# Patient Record
Sex: Male | Born: 1945 | Race: Asian | Hispanic: No | Marital: Married | Smoking: Former smoker
Health system: Southern US, Community
[De-identification: ages and names within clinical notes are randomized; demographics above are authoritative.]

## PROBLEM LIST (undated history)

## (undated) DIAGNOSIS — I2510 Atherosclerotic heart disease of native coronary artery without angina pectoris: Secondary | ICD-10-CM

## (undated) DIAGNOSIS — F329 Major depressive disorder, single episode, unspecified: Secondary | ICD-10-CM

## (undated) DIAGNOSIS — I10 Essential (primary) hypertension: Secondary | ICD-10-CM

## (undated) DIAGNOSIS — E785 Hyperlipidemia, unspecified: Secondary | ICD-10-CM

## (undated) DIAGNOSIS — I639 Cerebral infarction, unspecified: Secondary | ICD-10-CM

## (undated) DIAGNOSIS — E119 Type 2 diabetes mellitus without complications: Secondary | ICD-10-CM

## (undated) DIAGNOSIS — K219 Gastro-esophageal reflux disease without esophagitis: Secondary | ICD-10-CM

## (undated) DIAGNOSIS — F32A Depression, unspecified: Secondary | ICD-10-CM

## (undated) HISTORY — DX: Major depressive disorder, single episode, unspecified: F32.9

## (undated) HISTORY — DX: Cerebral infarction, unspecified: I63.9

## (undated) HISTORY — DX: Hyperlipidemia, unspecified: E78.5

## (undated) HISTORY — DX: Type 2 diabetes mellitus without complications: E11.9

## (undated) HISTORY — DX: Gastro-esophageal reflux disease without esophagitis: K21.9

## (undated) HISTORY — DX: Essential (primary) hypertension: I10

## (undated) HISTORY — DX: Depression, unspecified: F32.A

---

## 2014-02-10 DIAGNOSIS — G8929 Other chronic pain: Secondary | ICD-10-CM | POA: Insufficient documentation

## 2014-02-10 DIAGNOSIS — R29898 Other symptoms and signs involving the musculoskeletal system: Secondary | ICD-10-CM | POA: Insufficient documentation

## 2014-03-01 DIAGNOSIS — R7301 Impaired fasting glucose: Secondary | ICD-10-CM | POA: Insufficient documentation

## 2015-01-10 DIAGNOSIS — Z86010 Personal history of colonic polyps: Secondary | ICD-10-CM | POA: Insufficient documentation

## 2015-01-10 DIAGNOSIS — D130 Benign neoplasm of esophagus: Secondary | ICD-10-CM | POA: Insufficient documentation

## 2015-01-10 DIAGNOSIS — K7689 Other specified diseases of liver: Secondary | ICD-10-CM | POA: Insufficient documentation

## 2015-07-05 DIAGNOSIS — I6381 Other cerebral infarction due to occlusion or stenosis of small artery: Secondary | ICD-10-CM | POA: Insufficient documentation

## 2015-07-05 DIAGNOSIS — K229 Disease of esophagus, unspecified: Secondary | ICD-10-CM | POA: Insufficient documentation

## 2015-07-05 DIAGNOSIS — F32A Depression, unspecified: Secondary | ICD-10-CM | POA: Insufficient documentation

## 2015-07-05 DIAGNOSIS — K635 Polyp of colon: Secondary | ICD-10-CM | POA: Insufficient documentation

## 2015-11-19 DIAGNOSIS — F5101 Primary insomnia: Secondary | ICD-10-CM | POA: Insufficient documentation

## 2015-11-19 DIAGNOSIS — Z8673 Personal history of transient ischemic attack (TIA), and cerebral infarction without residual deficits: Secondary | ICD-10-CM | POA: Insufficient documentation

## 2015-11-19 DIAGNOSIS — R351 Nocturia: Secondary | ICD-10-CM | POA: Insufficient documentation

## 2015-11-19 DIAGNOSIS — R3912 Poor urinary stream: Secondary | ICD-10-CM | POA: Insufficient documentation

## 2015-12-03 DIAGNOSIS — K769 Liver disease, unspecified: Secondary | ICD-10-CM | POA: Insufficient documentation

## 2015-12-03 DIAGNOSIS — K449 Diaphragmatic hernia without obstruction or gangrene: Secondary | ICD-10-CM | POA: Insufficient documentation

## 2015-12-03 DIAGNOSIS — R413 Other amnesia: Secondary | ICD-10-CM | POA: Insufficient documentation

## 2016-03-28 DIAGNOSIS — H259 Unspecified age-related cataract: Secondary | ICD-10-CM | POA: Insufficient documentation

## 2016-03-29 DIAGNOSIS — M5137 Other intervertebral disc degeneration, lumbosacral region: Secondary | ICD-10-CM | POA: Insufficient documentation

## 2016-03-31 DIAGNOSIS — E119 Type 2 diabetes mellitus without complications: Secondary | ICD-10-CM | POA: Insufficient documentation

## 2016-06-03 DIAGNOSIS — R200 Anesthesia of skin: Secondary | ICD-10-CM | POA: Insufficient documentation

## 2016-07-24 DIAGNOSIS — I351 Nonrheumatic aortic (valve) insufficiency: Secondary | ICD-10-CM | POA: Insufficient documentation

## 2017-03-10 DIAGNOSIS — F32A Depression, unspecified: Secondary | ICD-10-CM | POA: Insufficient documentation

## 2017-06-09 HISTORY — PX: COLONOSCOPY WITH ESOPHAGOGASTRODUODENOSCOPY (EGD): SHX5779

## 2018-01-11 ENCOUNTER — Ambulatory Visit: Payer: Self-pay | Admitting: Family Medicine

## 2018-01-11 DIAGNOSIS — Z0289 Encounter for other administrative examinations: Secondary | ICD-10-CM

## 2018-01-12 ENCOUNTER — Telehealth: Payer: Self-pay

## 2018-01-13 ENCOUNTER — Telehealth: Payer: Self-pay | Admitting: Family Medicine

## 2018-01-13 ENCOUNTER — Ambulatory Visit: Payer: Medicare Other | Admitting: Family Medicine

## 2018-01-13 ENCOUNTER — Encounter: Payer: Self-pay | Admitting: Family Medicine

## 2018-01-13 ENCOUNTER — Other Ambulatory Visit: Payer: Self-pay | Admitting: Family Medicine

## 2018-01-13 VITALS — BP 120/80 | HR 61 | Temp 97.8°F | Ht 66.0 in | Wt 152.5 lb

## 2018-01-13 DIAGNOSIS — H9193 Unspecified hearing loss, bilateral: Secondary | ICD-10-CM | POA: Insufficient documentation

## 2018-01-13 DIAGNOSIS — E119 Type 2 diabetes mellitus without complications: Secondary | ICD-10-CM

## 2018-01-13 DIAGNOSIS — E1165 Type 2 diabetes mellitus with hyperglycemia: Secondary | ICD-10-CM | POA: Insufficient documentation

## 2018-01-13 DIAGNOSIS — H269 Unspecified cataract: Secondary | ICD-10-CM

## 2018-01-13 LAB — HEMOGLOBIN A1C: HEMOGLOBIN A1C: 6.8 % — AB (ref 4.6–6.5)

## 2018-01-13 MED ORDER — METFORMIN HCL 500 MG PO TABS: 500.0000 mg | ORAL_TABLET | Freq: Two times a day (BID) | ORAL | 1 refills | Status: DC

## 2018-01-13 MED ORDER — HYDROCHLOROTHIAZIDE 12.5 MG PO CAPS: 12.5000 mg | ORAL_CAPSULE | Freq: Every day | ORAL | 1 refills | Status: DC

## 2018-01-13 MED ORDER — METOPROLOL TARTRATE 25 MG PO TABS: 25.0000 mg | ORAL_TABLET | Freq: Two times a day (BID) | ORAL | 1 refills | Status: DC

## 2018-01-13 MED ORDER — MIRTAZAPINE 30 MG PO TABS: 30.0000 mg | ORAL_TABLET | Freq: Every day | ORAL | 1 refills | Status: DC

## 2018-01-13 MED ORDER — AMLODIPINE BESYLATE 10 MG PO TABS: 10.0000 mg | ORAL_TABLET | Freq: Every day | ORAL | 1 refills | Status: DC

## 2018-01-13 MED ORDER — ATORVASTATIN CALCIUM 40 MG PO TABS: 40.0000 mg | ORAL_TABLET | Freq: Every day | ORAL | 3 refills | Status: DC

## 2018-01-13 MED ORDER — CLOPIDOGREL BISULFATE 75 MG PO TABS: 75.0000 mg | ORAL_TABLET | Freq: Every day | ORAL | 1 refills | Status: DC

## 2018-01-13 MED ORDER — ATORVASTATIN CALCIUM 40 MG PO TABS: 40.0000 mg | ORAL_TABLET | Freq: Every day | ORAL | 1 refills | Status: DC

## 2018-01-13 NOTE — Patient Instructions (Addendum)
Keep the diet clean and stay active.  If you do not hear anything about your referrasl in the next 1-2 weeks, call our office and ask for an update.  Give [place] 2-3 business days to get the results of your labs back.   Call your pharmacy for the shingles vaccine and flu shot.   Let us know if you need anything.

## 2018-01-13 NOTE — Progress Notes (Signed)
Chief Complaint  Patient presents with  . New Patient (Initial Visit)       New Patient Visit SUBJECTIVE: HPI: Bryan Henderson is an 72 y.o.male who is being seen for establishing care.     The patient was previously seen in [place].  He is here with his wife who helps provide the history due to her superior [place] speaking skills.  Patient with a relatively recent history of diabetes.  He was started on metformin around 3 months ago.  He has been tolerating well.  He exercises and eats healthy overall.  With the general [place], he does consume a lot of carbohydrates.  He does not have an eye provider in this area.  He plans get a flu shot at the pharmacy.  He is reportedly up-to-date with his pneumonia vaccines.  Patient has a history of hearing loss.  He was seen by an audiologist for years ago and was told he needed hearing aids.  The right side is worse than on the left.  No injury.  He is not having any pain or drainage from the ear.  He has a history of bilateral cataracts.  He was following closely with an eye provider up [place].  His wife would like him to see a specialist in case he needs surgery.  No Known Allergies  Past Medical History:  Diagnosis Date  . Depression   . Diabetes mellitus without complication ([place])   . Hypertension   . Stroke [facility])    History reviewed. No pertinent surgical history. History reviewed. No pertinent family history. No Known Allergies  Current Outpatient Medications:  .  amLODipine (NORVASC) 10 MG tablet, Take 1 tablet (10 mg total) by mouth daily., Disp: 90 tablet, Rfl: 1 .  b complex vitamins capsule, Take 1 capsule by mouth daily., Disp: , Rfl:  .  clopidogrel (PLAVIX) 75 MG tablet, Take 1 tablet (75 mg total) by mouth daily., Disp: 90 tablet, Rfl: 1 .  hydrochlorothiazide (MICROZIDE) 12.5 MG capsule, Take 1 capsule (12.5 mg total) by mouth daily., Disp: 90 capsule, Rfl: 1 .  metFORMIN (GLUCOPHAGE) 500 MG tablet, Take 1 tablet (500 mg  total) by mouth 2 (two) times daily with a meal., Disp: 180 tablet, Rfl: 1 .  metoprolol tartrate (LOPRESSOR) 25 MG tablet, Take 1 tablet (25 mg total) by mouth 2 (two) times daily., Disp: 180 tablet, Rfl: 1 .  mirtazapine (REMERON) 30 MG tablet, Take 1 tablet (30 mg total) by mouth at bedtime., Disp: 90 tablet, Rfl: 1 .  Multiple Vitamin (MULTIVITAMIN) tablet, Take 1 tablet by mouth daily., Disp: , Rfl:  .  Omega-3 Fatty Acids (FISH OIL) 1000 MG CAPS, Take 1 capsule by mouth daily., Disp: , Rfl:  .  vitamin C (ASCORBIC ACID) 500 MG tablet, Take 500 mg by mouth daily., Disp: , Rfl:  .  atorvastatin (LIPITOR) 40 MG tablet, Take 1 tablet (40 mg total) by mouth daily., Disp: 90 tablet, Rfl: 1    ROS Cardiovascular: Denies chest pain  Respiratory: Denies dyspnea   OBJECTIVE: BP 120/80 (BP Location: Left Arm, Patient Position: Sitting, Cuff Size: Normal)   Pulse 61   Temp 97.8 F (36.6 C) (Oral)   Ht 5\' 6"  (1.676 m)   Wt 152 lb 8 oz (69.2 kg)   SpO2 93%   BMI 24.61 kg/m   Constitutional: -  VS reviewed -  Well developed, well nourished, appears stated age -  No apparent distress  Psychiatric: -  Oriented to  person, place, and time -  Memory intact -  Affect and mood normal -  Fluent conversation, good eye contact -  Judgment and insight age appropriate  Eye: -  Conjunctivae clear, no discharge -  Pupils symmetric, round, reactive to light  ENMT: -  MMM    Pharynx moist, no exudate, no erythema  Neck: -  No gross swelling, no palpable masses -  Thyroid midline, not enlarged, mobile, no palpable masses  Cardiovascular: -  RRR -  No LE edema  Respiratory: -  Normal respiratory effort, no accessory muscle use, no retraction -  Breath sounds equal, no wheezes, no ronchi, no crackles  Musculoskeletal: -  No clubbing, no cyanosis -  Gait normal  Skin: -  No significant lesion on inspection -  Warm and dry to palpation   ASSESSMENT/PLAN: Diabetes mellitus type 2 in nonobese (HCC) -  Plan: Hemoglobin A1c, atorvastatin (LIPITOR) 40 MG tablet  Bilateral hearing loss, unspecified hearing loss type - Plan: Ambulatory referral to Audiology  Cataract of both eyes, unspecified cataract type - Plan: Ambulatory referral to Ophthalmology  Patient instructed to sign release of records form from his previous PCP. Counseled on diet and exercise. With a history of stroke and diabetes, will add Lipitor 40 mg daily. Referral to ophthalmology and audiology placed. Patient should return in 3 months for physical. The patient voiced understanding and agreement to the plan.   Swift, DO 01/13/18  12:21 PM

## 2018-01-13 NOTE — Telephone Encounter (Signed)
Called the patient to inform ok to schedule lab appt one week prior to February appt.  Currently appt is scheduled for 04/15/18.   Needs to change that to one week further out and schedule lab appt prior to that appt.(per PCP instructions, labs must be 90 days from previous ones)  Lab ordered entered.

## 2018-01-13 NOTE — Progress Notes (Signed)
Pre visit review using our clinic review tool, if applicable. No additional management support is needed unless otherwise documented below in the visit note. 

## 2018-01-14 ENCOUNTER — Telehealth: Payer: Self-pay

## 2018-01-14 NOTE — Telephone Encounter (Signed)
Copied from [place] [id number]. Topic: Referral - Status >> Jan 14, 2018 10:30 AM Sheran Luz wrote: Reason for CRM: Debbie from [facility] called stating patient has been scheduled for 12/11 at 10:00 with Dr. Freddi Che

## 2018-01-14 NOTE — Telephone Encounter (Signed)
Changes appt/scheduled lab appts.

## 2018-01-18 ENCOUNTER — Ambulatory Visit: Payer: Self-pay

## 2018-01-18 MED ORDER — MIRTAZAPINE 30 MG PO TABS: 30.0000 mg | ORAL_TABLET | Freq: Every day | ORAL | 1 refills | Status: DC

## 2018-01-18 NOTE — Telephone Encounter (Signed)
Called left message to call back 

## 2018-01-18 NOTE — Telephone Encounter (Signed)
Please verify that the metoprolol tartrate is what he is taking as that is a twice daily medication.   If he is on Metformin once daily, with his recent A1c, we could try going off of it. Please make sure that he is not on the long acting formulation of Metformin.   I thought we called in Remeron, but will send again.   TY.

## 2018-01-18 NOTE — Telephone Encounter (Signed)
Pt's wife called who spoke on behalf of her husband. Wife called stating that per pt's pill bottles his Metformin is one time a day not two times a day and his Metoprolol should be one time a day not two times per day. Wife stated that she wrote the information that was on his pill bottles on the medication record when they checked in to the appointment.  Per OV notes the Metformin and Metoprolol is twice daily.  Sending note to office for review.  Wife stated that the Remeron was not filled. Called [facility] and med will be filled today. Wife informed.    Reason for Disposition . [1] Caller requesting NON-URGENT health information AND [2] PCP's office is the best resource  Protocols used: INFORMATION ONLY CALL-A-AH

## 2018-01-18 NOTE — Addendum Note (Signed)
Addended by: Ames Coupe on: 01/18/2018 11:56 AM   Modules accepted: Orders

## 2018-01-19 NOTE — Telephone Encounter (Signed)
Called left message to call back 

## 2018-01-20 NOTE — Telephone Encounter (Signed)
Spoke to the patients wife Updated metoprolol to once daily per wife stated he takes only once a day. They would like to continue metformin 500 once daily until next lab check in February.  Then if a1c still good may consider at that time stopping. She will check with pharmacy regarding remeron today---was informed sent in. The wife verbalized understanding of all instructions.

## 2018-02-03 DIAGNOSIS — H903 Sensorineural hearing loss, bilateral: Secondary | ICD-10-CM | POA: Diagnosis not present

## 2018-03-05 NOTE — Telephone Encounter (Signed)
Error

## 2018-03-15 ENCOUNTER — Telehealth: Payer: Self-pay | Admitting: Family Medicine

## 2018-03-15 NOTE — Telephone Encounter (Signed)
Copied from [place] [phone] [phone]. Topic: Referral - Question >> Mar 12, 2018 10:27 AM Rutherford Nail, NT wrote: Reason for CRM: Patient's wife, Hylin, calling and states that they are at the ophthalmologist office and they do not have any records of the patient. States that she was under the impression that this ophthalmologist was associated with [facility]. Would like a call asap to discuss. Please advise.

## 2018-03-22 ENCOUNTER — Emergency Department (HOSPITAL_COMMUNITY): Payer: Medicare Other

## 2018-03-22 ENCOUNTER — Telehealth: Payer: Self-pay | Admitting: Family Medicine

## 2018-03-22 ENCOUNTER — Encounter (HOSPITAL_COMMUNITY): Payer: Self-pay | Admitting: Emergency Medicine

## 2018-03-22 ENCOUNTER — Ambulatory Visit (HOSPITAL_BASED_OUTPATIENT_CLINIC_OR_DEPARTMENT_OTHER)
Admission: RE | Admit: 2018-03-22 | Discharge: 2018-03-22 | Disposition: A | Discharge status: Home / Self Care | Payer: Medicare Other | Source: Ambulatory Visit | Attending: Internal Medicine | Admitting: Internal Medicine

## 2018-03-22 ENCOUNTER — Inpatient Hospital Stay (HOSPITAL_COMMUNITY)
Admission: EM | Admit: 2018-03-22 | Discharge: 2018-03-25 | DRG: 247 | Disposition: A | Discharge status: Home / Self Care | Payer: Medicare Other | Attending: Cardiology | Admitting: Cardiology

## 2018-03-22 ENCOUNTER — Other Ambulatory Visit: Payer: Self-pay

## 2018-03-22 ENCOUNTER — Ambulatory Visit (INDEPENDENT_AMBULATORY_CARE_PROVIDER_SITE_OTHER): Payer: Medicare Other | Admitting: Internal Medicine

## 2018-03-22 ENCOUNTER — Encounter: Payer: Self-pay | Admitting: Internal Medicine

## 2018-03-22 VITALS — BP 126/78 | HR 67 | Temp 97.8°F | Resp 16 | Ht 66.0 in | Wt 147.4 lb

## 2018-03-22 DIAGNOSIS — Z7902 Long term (current) use of antithrombotics/antiplatelets: Secondary | ICD-10-CM

## 2018-03-22 DIAGNOSIS — Z955 Presence of coronary angioplasty implant and graft: Secondary | ICD-10-CM | POA: Diagnosis not present

## 2018-03-22 DIAGNOSIS — I10 Essential (primary) hypertension: Secondary | ICD-10-CM | POA: Diagnosis not present

## 2018-03-22 DIAGNOSIS — E785 Hyperlipidemia, unspecified: Secondary | ICD-10-CM | POA: Diagnosis not present

## 2018-03-22 DIAGNOSIS — Z8673 Personal history of transient ischemic attack (TIA), and cerebral infarction without residual deficits: Secondary | ICD-10-CM | POA: Diagnosis not present

## 2018-03-22 DIAGNOSIS — R079 Chest pain, unspecified: Secondary | ICD-10-CM

## 2018-03-22 DIAGNOSIS — I214 Non-ST elevation (NSTEMI) myocardial infarction: Principal | ICD-10-CM | POA: Diagnosis present

## 2018-03-22 DIAGNOSIS — R0789 Other chest pain: Secondary | ICD-10-CM | POA: Diagnosis not present

## 2018-03-22 DIAGNOSIS — I2510 Atherosclerotic heart disease of native coronary artery without angina pectoris: Secondary | ICD-10-CM | POA: Diagnosis not present

## 2018-03-22 DIAGNOSIS — E119 Type 2 diabetes mellitus without complications: Secondary | ICD-10-CM | POA: Diagnosis not present

## 2018-03-22 DIAGNOSIS — I209 Angina pectoris, unspecified: Secondary | ICD-10-CM | POA: Diagnosis not present

## 2018-03-22 DIAGNOSIS — R0902 Hypoxemia: Secondary | ICD-10-CM | POA: Diagnosis not present

## 2018-03-22 DIAGNOSIS — K219 Gastro-esophageal reflux disease without esophagitis: Secondary | ICD-10-CM | POA: Diagnosis present

## 2018-03-22 DIAGNOSIS — I259 Chronic ischemic heart disease, unspecified: Secondary | ICD-10-CM | POA: Diagnosis present

## 2018-03-22 DIAGNOSIS — Z87891 Personal history of nicotine dependence: Secondary | ICD-10-CM | POA: Diagnosis not present

## 2018-03-22 DIAGNOSIS — I119 Hypertensive heart disease without heart failure: Secondary | ICD-10-CM | POA: Diagnosis not present

## 2018-03-22 DIAGNOSIS — R000 Tachycardia, unspecified: Secondary | ICD-10-CM | POA: Diagnosis not present

## 2018-03-22 DIAGNOSIS — E1165 Type 2 diabetes mellitus with hyperglycemia: Secondary | ICD-10-CM

## 2018-03-22 HISTORY — DX: Atherosclerotic heart disease of native coronary artery without angina pectoris: I25.10

## 2018-03-22 LAB — COMPREHENSIVE METABOLIC PANEL
ALT: 27 U/L (ref 0–44)
AST: 50 U/L — ABNORMAL HIGH (ref 15–41)
Albumin: 3.8 g/dL (ref 3.5–5.0)
Alkaline Phosphatase: 53 U/L (ref 38–126)
Anion gap: 14 (ref 5–15)
BILIRUBIN TOTAL: 0.8 mg/dL (ref 0.3–1.2)
BUN: 9 mg/dL (ref 8–23)
CO2: 25 mmol/L (ref 22–32)
Calcium: 8.8 mg/dL — ABNORMAL LOW (ref 8.9–10.3)
Chloride: 93 mmol/L — ABNORMAL LOW (ref 98–111)
Creatinine, Ser: 0.8 mg/dL (ref 0.61–1.24)
GFR calc Af Amer: >60 mL/min (ref >60)
GFR calc non Af Amer: >60 mL/min (ref >60)
Glucose, Bld: 182 mg/dL — ABNORMAL HIGH (ref 70–99)
Potassium: 3.3 mmol/L — ABNORMAL LOW (ref 3.5–5.1)
Sodium: 132 mmol/L — ABNORMAL LOW (ref 135–145)
TOTAL PROTEIN: 7.1 g/dL (ref 6.5–8.1)

## 2018-03-22 LAB — CBG MONITORING, ED: Glucose-Capillary: 176 mg/dL — ABNORMAL HIGH (ref 70–99)

## 2018-03-22 LAB — CBC WITH DIFFERENTIAL/PLATELET
Absolute Monocytes: 482 cells/uL (ref 200–950)
BASOS ABS: 27 {cells}/uL (ref 0–200)
Basophils Relative: 0.4 %
EOS ABS: 20 {cells}/uL (ref 15–500)
Eosinophils Relative: 0.3 %
HEMATOCRIT: 49.1 % (ref 38.5–50.0)
Hemoglobin: 17 g/dL (ref 13.2–17.1)
Lymphs Abs: 1320 cells/uL (ref 850–3900)
MCH: 31.3 pg (ref 27.0–33.0)
MCHC: 34.6 g/dL (ref 32.0–36.0)
MCV: 90.3 fL (ref 80.0–100.0)
MPV: 10.7 fL (ref 7.5–12.5)
Monocytes Relative: 7.2 %
Neutro Abs: 4851 cells/uL (ref 1500–7800)
Neutrophils Relative %: 72.4 %
Platelets: 287 10*3/uL (ref 140–400)
RBC: 5.44 10*6/uL (ref 4.20–5.80)
RDW: 12.9 % (ref 11.0–15.0)
Total Lymphocyte: 19.7 %
WBC: 6.7 10*3/uL (ref 3.8–10.8)

## 2018-03-22 LAB — CBC
HCT: 47.5 % (ref 39.0–52.0)
HEMOGLOBIN: 16.4 g/dL (ref 13.0–17.0)
MCH: 30.3 pg (ref 26.0–34.0)
MCHC: 34.5 g/dL (ref 30.0–36.0)
MCV: 87.6 fL (ref 80.0–100.0)
Platelets: 266 10*3/uL (ref 150–400)
RBC: 5.42 MIL/uL (ref 4.22–5.81)
RDW: 12.4 % (ref 11.5–15.5)
WBC: 7.2 10*3/uL (ref 4.0–10.5)
nRBC: 0 % (ref 0.0–0.2)

## 2018-03-22 LAB — BASIC METABOLIC PANEL
BUN: 11 mg/dL (ref 7–25)
CO2: 30 mmol/L (ref 20–32)
CREATININE: 0.92 mg/dL (ref 0.70–1.18)
Calcium: 9.6 mg/dL (ref 8.6–10.3)
Chloride: 96 mmol/L — ABNORMAL LOW (ref 98–110)
Glucose, Bld: 194 mg/dL — ABNORMAL HIGH (ref 65–99)
Potassium: 4.1 mmol/L (ref 3.5–5.3)
Sodium: 135 mmol/L (ref 135–146)

## 2018-03-22 LAB — TROPONIN I: Troponin I: 1.01 ng/mL (ref <=0.0)

## 2018-03-22 LAB — PROTIME-INR
INR: 1.03
Prothrombin Time: 13.4 seconds (ref 11.4–15.2)

## 2018-03-22 LAB — I-STAT TROPONIN, ED: Troponin i, poc: 0.91 ng/mL (ref 0.00–0.08)

## 2018-03-22 LAB — MAGNESIUM: MAGNESIUM: 1.6 mg/dL — AB (ref 1.7–2.4)

## 2018-03-22 MED ORDER — POTASSIUM CHLORIDE CRYS ER 20 MEQ PO TBCR
40.0000 meq | EXTENDED_RELEASE_TABLET | Freq: Once | ORAL | Status: AC
  Administered 2018-03-23: 40 meq via ORAL

## 2018-03-22 MED ORDER — HEPARIN (PORCINE) 25000 UT/250ML-% IV SOLN
800.0000 [IU]/h | INTRAVENOUS | Status: DC
  Administered 2018-03-22: 800 [IU]/h via INTRAVENOUS
  Filled 2018-03-22: qty 250

## 2018-03-22 MED ORDER — ASPIRIN 81 MG PO CHEW
324.0000 mg | CHEWABLE_TABLET | Freq: Once | ORAL | Status: AC
  Administered 2018-03-22: 324 mg via ORAL
  Filled 2018-03-22: qty 4

## 2018-03-22 MED ORDER — NITROGLYCERIN 0.4 MG SL SUBL
0.4000 mg | SUBLINGUAL_TABLET | SUBLINGUAL | Status: DC | PRN
  Administered 2018-03-22: 0.4 mg via SUBLINGUAL
  Filled 2018-03-22: qty 1

## 2018-03-22 MED ORDER — MAGNESIUM SULFATE 2 GM/50ML IV SOLN
2.0000 g | Freq: Once | INTRAVENOUS | Status: AC
  Administered 2018-03-22: 2 g via INTRAVENOUS
  Filled 2018-03-22: qty 50

## 2018-03-22 MED ORDER — PANTOPRAZOLE SODIUM 40 MG PO TBEC: 40.0000 mg | DELAYED_RELEASE_TABLET | Freq: Every day | ORAL | 0 refills | Status: DC

## 2018-03-22 MED ORDER — HEPARIN SODIUM (PORCINE) 5000 UNIT/ML IJ SOLN
60.0000 [IU]/kg | Freq: Once | INTRAMUSCULAR | Status: AC
  Administered 2018-03-22: 3950 [IU] via INTRAVENOUS
  Filled 2018-03-22: qty 1

## 2018-03-22 MED ORDER — SODIUM CHLORIDE 0.9% FLUSH: 3.0000 mL | Freq: Once | INTRAVENOUS | Status: DC

## 2018-03-22 MED ORDER — NITROGLYCERIN IN D5W 200-5 MCG/ML-% IV SOLN
0.0000 ug/min | INTRAVENOUS | Status: DC
  Administered 2018-03-22: 5 ug/min via INTRAVENOUS
  Filled 2018-03-22: qty 250

## 2018-03-22 MED FILL — PANTOPRAZOLE SOD DR 40 MG T: 40 | 30 days supply | Qty: 30 | Fill #0

## 2018-03-22 NOTE — Progress Notes (Signed)
Pre visit review using our clinic review tool, if applicable. No additional management support is needed unless otherwise documented below in the visit note. 

## 2018-03-22 NOTE — ED Provider Notes (Signed)
[facility] EMERGENCY DEPARTMENT Provider Note   CSN: [id number] Arrival date & time: 03/22/18  2016     History   Chief Complaint Chief Complaint  Patient presents with  . Chest Pain  . Elevated Troponin    HPI Bryan Henderson is a 73 y.o. male.  HPI   Bryan Henderson is a 73 y.o. male with PMH of pression, diabetes, hypertension, CVA with no residual deficits on Plavix who presents with his wife at the bedside via EMS from home after the patient was found to have a positive troponin in the clinic earlier today.  The patient has no history of CAD and last had a stress test around 5 years ago while living in [place].  He is active and walks several miles per day several times per week.  Has never had chest pain or dyspnea on exertion.  Sunday night he woke up around 12:30 AM with a sour taste in his mouth and a burning in his chest.  He took Nexium and this improved slightly.  No history of GERD.  In the morning his pain returned and persisted so he went to the PCP.  At this time, the patient feels slightly improved after receiving nitroglycerin x1 and a full dose of aspirin by EMS.  Only a slight burning sensation in the substernal space at this time.  No nausea or vomiting and no lightheadedness or diaphoresis.  No recent falls or trauma.  No abdominal pain.  Past Medical History:  Diagnosis Date  . Depression   . Diabetes mellitus without complication ([place])   . Hypertension   . Stroke [facility])     Patient Active Problem List   Diagnosis Date Noted  . Cataract of both eyes 01/13/2018  . Diabetes mellitus type 2 in nonobese ([place]) 01/13/2018  . Bilateral hearing loss 01/13/2018    History reviewed. No pertinent surgical history.      Home Medications    Prior to Admission medications   Medication Sig Start Date End Date Taking? Authorizing Provider  amLODipine (NORVASC) 10 MG tablet Take 1 tablet (10 mg total) by mouth daily. 01/13/18  Yes Shelda Pal, DO  atorvastatin (LIPITOR) 40 MG tablet Take 1 tablet (40 mg total) by mouth daily. 01/13/18  Yes Shelda Pal, DO  b complex vitamins capsule Take 1 capsule by mouth daily.   Yes [provider]  clopidogrel (PLAVIX) 75 MG tablet Take 1 tablet (75 mg total) by mouth daily. 01/13/18  Yes Shelda Pal, DO  hydrochlorothiazide (MICROZIDE) 12.5 MG capsule Take 1 capsule (12.5 mg total) by mouth daily. 01/13/18  Yes Shelda Pal, DO  metFORMIN (GLUCOPHAGE) 500 MG tablet Take 1 tablet (500 mg total) by mouth 2 (two) times daily with a meal. 01/13/18  Yes Wendling, Crosby Oyster, DO  metoprolol tartrate (LOPRESSOR) 25 MG tablet Take 1 tablet (25 mg total) by mouth 2 (two) times daily. 01/13/18  Yes Shelda Pal, DO  mirtazapine (REMERON) 30 MG tablet Take 1 tablet (30 mg total) by mouth at bedtime. 01/18/18  Yes Shelda Pal, DO  Multiple Vitamin (MULTIVITAMIN) tablet Take 1 tablet by mouth daily.   Yes [provider]  Omega-3 Fatty Acids (FISH OIL) 1000 MG CAPS Take 1 capsule by mouth daily.   Yes [provider]  pantoprazole (PROTONIX) 40 MG tablet Take 1 tablet (40 mg total) by mouth daily. 03/22/18  Yes Colon Branch, MD  vitamin C (ASCORBIC ACID)  500 MG tablet Take 500 mg by mouth daily.   Yes [provider]    Family History No family history on file.  Social History Social History   Tobacco Use  . Smoking status: Never Smoker  . Smokeless tobacco: Never Used  Substance Use Topics  . Alcohol use: Never    Frequency: Never  . Drug use: Never     Allergies   Patient has no known allergies.   Review of Systems Review of Systems  Constitutional: Negative for chills and fever.  HENT: Negative for ear pain and sore throat.   Eyes: Negative for pain and visual disturbance.  Respiratory: Negative for cough and shortness of breath.   Cardiovascular: Positive for chest pain.  Negative for palpitations.  Gastrointestinal: Negative for abdominal pain and vomiting.  Genitourinary: Negative for dysuria and hematuria.  Musculoskeletal: Negative for arthralgias and back pain.  Skin: Negative for color change and rash.  Neurological: Negative for seizures and syncope.  All other systems reviewed and are negative.    Physical Exam Updated Vital Signs BP (!) 147/81   Pulse 86   Temp 97.6 F (36.4 C) (Oral)   Resp (!) 25   Ht 5\' 7"  (1.702 m)   Wt 66.2 kg   SpO2 94%   BMI 22.87 kg/m   Physical Exam Vitals signs and nursing note reviewed.  Constitutional:      Appearance: He is well-developed.  HENT:     Head: Normocephalic and atraumatic.  Eyes:     Conjunctiva/sclera: Conjunctivae normal.  Neck:     Musculoskeletal: Neck supple.  Cardiovascular:     Rate and Rhythm: Normal rate and regular rhythm.     Heart sounds: No murmur.  Pulmonary:     Effort: Pulmonary effort is normal. No respiratory distress.     Breath sounds: Normal breath sounds.  Abdominal:     Palpations: Abdomen is soft.     Tenderness: There is no abdominal tenderness.  Skin:    General: Skin is warm and dry.  Neurological:     Mental Status: He is alert.      ED Treatments / Results  Labs (all labs ordered are listed, but only abnormal results are displayed) Labs Reviewed  MAGNESIUM - Abnormal; Notable for the following components:      Result Value   Magnesium 1.6 (*)    All other components within normal limits  COMPREHENSIVE METABOLIC PANEL - Abnormal; Notable for the following components:   Sodium 132 (*)    Potassium 3.3 (*)    Chloride 93 (*)    Glucose, Bld 182 (*)    Calcium 8.8 (*)    AST 50 (*)    All other components within normal limits  I-STAT TROPONIN, ED - Abnormal; Notable for the following components:   Troponin i, poc 0.91 (*)    All other components within normal limits  CBG MONITORING, ED - Abnormal; Notable for the following components:    Glucose-Capillary 176 (*)    All other components within normal limits  I-STAT TROPONIN, ED - Abnormal; Notable for the following components:   Troponin i, poc 1.55 (*)    All other components within normal limits  CBC  PROTIME-INR  HEPARIN LEVEL (UNFRACTIONATED)  CBC  I-STAT TROPONIN, ED    EKG EKG Interpretation  Date/Time:  Monday March 22 2018 20:31:29 EST Ventricular Rate:  77 PR Interval:    QRS Duration: 87 QT Interval:  413 QTC Calculation: 468 R  Axis:     Text Interpretation:  Sinus rhythm Consider RVH or posterior infarct No old tracing to compare Confirmed by Merrily Pew [id number]) on 03/22/2018 8:51:25 PM   Radiology Dg Chest Portable 1 View  Result Date: 03/22/2018 CLINICAL DATA:  Chest pain. EXAM: PORTABLE CHEST 1 VIEW COMPARISON:  Chest x-ray from same day at 15:37. FINDINGS: The heart size and mediastinal contours are within normal limits. Normal pulmonary vascularity. No focal consolidation, pleural effusion, or pneumothorax. No acute osseous abnormality. IMPRESSION: No active disease. Electronically Signed   By: Titus Dubin M.D.   On: 03/22/2018 21:28    Procedures Procedures (including critical care time)  Medications Ordered in ED Medications  sodium chloride flush (NS) 0.9 % injection 3 mL (3 mLs Intravenous Not Given 03/22/18 2317)  nitroGLYCERIN (NITROSTAT) SL tablet 0.4 mg (0.4 mg Sublingual Given 03/22/18 2106)  nitroGLYCERIN 50 mg in dextrose 5 % 250 mL (0.2 mg/mL) infusion (20 mcg/min Intravenous Rate/Dose Change 03/23/18 0043)  heparin ADULT infusion 100 units/mL (25000 units/256mL sodium chloride 0.45%) (800 Units/hr Intravenous New Bag/Given 03/22/18 2311)  potassium chloride SA (K-DUR,KLOR-CON) CR tablet 40 mEq (has no administration in time range)  aspirin chewable tablet 324 mg (324 mg Oral Given 03/22/18 2107)  heparin injection 3,950 Units (3,950 Units Intravenous Given 03/22/18 2108)  magnesium sulfate IVPB 2 g 50 mL (0 g Intravenous  Stopped 03/23/18 0016)     Initial Impression / Assessment and Plan / ED Course  I have reviewed the triage vital signs and the nursing notes.  Pertinent labs & imaging results that were available during my care of the patient were reviewed by me and considered in my medical decision making (see chart for details).     MDM:  Imaging: Chest x-ray normal.  ED Provider Interpretation of EKG: Sinus rhythm with a rate of 77 bpm, normal axis, no ST segment elevation.  Subtle ST segment depression in lead V3.  Flattening of T waves in leads aVL and V2.  Slight worsening of isolated depression in V3 when compared to EKG from earlier today at around 3 PM.  Labs: I-STAT troponin 0.91 and increased to 1.55, glucose 176, CBC normal, CMP with a potassium of 3.3 and otherwise unremarkable, magnesium 1.6, INR 1.03  On initial evaluation, patient appears well. Afebrile and hemodynamically stable. Alert and oriented x4, pleasant, and cooperative.  Patient presents with chest discomfort and positive troponin from clinic as detailed above.  On exam, patient has no focal abnormality.  EKG obtained on arrival shows subtle increase in ST segment depression in lead V3 which is isolated without reciprocal changes or evidence for ST segment elevation when compared to previous EKG from earlier today.  He reported only mild discomfort at this time after receiving sublingual nitroglycerin x1 by EMS.  Additional sublingual nitroglycerin ordered and nitroglycerin infusion ordered as well.  Chest x-ray is unremarkable and he has no neurologic deficit or pulse discrepancy.  No back pain.  Doubt dissection.  No pneumonia or pneumothorax.  No evidence for infectious etiology.  Doubt pericarditis or myocarditis.  His story is somewhat inconsistent with ACS, however, his troponin increased from 0.9-1.5.  After initial presentation prior to increase in troponin (after confirming with the patient and his wife that he takes no other  anticoagulants) patient was given heparin bolus and started on heparin infusion.  Given 2 g IV magnesium for mag 1.6 as above and given 40 mEq of K LR in the ED.  Cardiology was consulted and  evaluated patient at the bedside in the ED.  Cardiology admitted patient thereafter awaiting his bed in stable condition.  He was pain-free at time of transfer of care while waiting inpatient bed.  The plan for this patient was discussed with Dr. Dayna Barker who voiced agreement and who oversaw evaluation and treatment of this patient.   The patient was fully informed and involved with the history taking, evaluation, workup including labs/images, and plan. The patient's concerns and questions were addressed to the patient's satisfaction and he expressed agreement with the plan to admit.    Final Clinical Impressions(s) / ED Diagnoses   Final diagnoses:  NSTEMI (non-ST elevated myocardial infarction) ([place])  Chest pain due to myocardial ischemia, unspecified ischemic chest pain type    ED Discharge Orders    None       Yacoub Diltz, Rodena Goldmann, MD 03/23/18 9977    Merrily Pew, MD 03/24/18 [phone]

## 2018-03-22 NOTE — Progress Notes (Signed)
Subjective:    Patient ID: Bryan Henderson, male    DOB: Jun 16, 1945, 73 y.o.   MRN: [id number]  DOS:  03/22/2018 Type of visit - description: Acute visit, here with his wife. The history was obtained initially from the wife and then from the patient who seems to understand English but has a difficult time explaining his symptoms. Apparently he is having pain on and off at the mid- upper chest, x 1 week; mostly with food intake, described as a sour or burning feeling. Last night, was awake by it, the pain lasted about 2 hours, eventually took Nexium and symptoms abated. No symptoms today.  Reports he had a colonoscopy and a normal EGD 2 years ago when he was living in [place].  Does not recall why EGD was done  but it was normal.   Review of Systems Denies fever chills No cough No difficulty breathing No palpitations Denies any change in the bowel habits or change in the color of the stools.  No dysphagia or odynophagia  Past Medical History:  Diagnosis Date  . Depression   . Diabetes mellitus without complication ([place])   . Hypertension   . Stroke [facility])     No past surgical history on file.  Social History   Socioeconomic History  . Marital status: Married    Spouse name: Not on file  . Number of children: Not on file  . Years of education: Not on file  . Highest education level: Not on file  Occupational History  . Not on file  Social Needs  . Financial resource strain: Not on file  . Food insecurity:    Worry: Not on file    Inability: Not on file  . Transportation needs:    Medical: Not on file    Non-medical: Not on file  Tobacco Use  . Smoking status: Never Smoker  . Smokeless tobacco: Never Used  Substance and Sexual Activity  . Alcohol use: Never    Frequency: Never  . Drug use: Never  . Sexual activity: Not on file  Lifestyle  . Physical activity:    Days per week: Not on file    Minutes per session: Not on file  . Stress: Not on file    Relationships  . Social connections:    Talks on phone: Not on file    Gets together: Not on file    Attends religious service: Not on file    Active member of club or organization: Not on file    Attends meetings of clubs or organizations: Not on file    Relationship status: Not on file  . Intimate partner violence:    Fear of current or ex partner: Not on file    Emotionally abused: Not on file    Physically abused: Not on file    Forced sexual activity: Not on file  Other Topics Concern  . Not on file  Social History Narrative  . Not on file      Allergies as of 03/22/2018   No Known Allergies     Medication List       Accurate as of March 22, 2018  2:40 PM. Always use your most recent med list.        amLODipine 10 MG tablet Commonly known as:  NORVASC Take 1 tablet (10 mg total) by mouth daily.   atorvastatin 40 MG tablet Commonly known as:  LIPITOR Take 1 tablet (40 mg total) by mouth daily.  b complex vitamins capsule Take 1 capsule by mouth daily.   clopidogrel 75 MG tablet Commonly known as:  PLAVIX Take 1 tablet (75 mg total) by mouth daily.   Fish Oil 1000 MG Caps Take 1 capsule by mouth daily.   hydrochlorothiazide 12.5 MG capsule Commonly known as:  MICROZIDE Take 1 capsule (12.5 mg total) by mouth daily.   metFORMIN 500 MG tablet Commonly known as:  GLUCOPHAGE Take 1 tablet (500 mg total) by mouth 2 (two) times daily with a meal.   metoprolol tartrate 25 MG tablet Commonly known as:  LOPRESSOR Take 1 tablet (25 mg total) by mouth 2 (two) times daily.   mirtazapine 30 MG tablet Commonly known as:  REMERON Take 1 tablet (30 mg total) by mouth at bedtime.   multivitamin tablet Take 1 tablet by mouth daily.   vitamin C 500 MG tablet Commonly known as:  ASCORBIC ACID Take 500 mg by mouth daily.           Objective:   Physical Exam BP 126/78 (BP Location: Left Arm, Patient Position: Sitting, Cuff Size: Small)   Pulse 67    Temp 97.8 F (36.6 C) (Oral)   Resp 16   Ht 5\' 6"  (1.676 m)   Wt 147 lb 6 oz (66.8 kg)   SpO2 98%   BMI 23.79 kg/m        General:   Well developed, NAD, BMI noted.  HEENT:  Normocephalic . Face symmetric, atraumatic Lungs:  CTA B Normal respiratory effort, no intercostal retractions, no accessory muscle use. Heart: RRR,  no murmur.  no pretibial edema bilaterally  Abdomen:  Not distended, soft, non-tender. No rebound or rigidity.   Skin: Not pale. Not jaundice Neurologic:  alert & oriented X3.  Speech normal, gait appropriate for age and unassisted Psych--  Cognition and judgment appear intact.  Cooperative with normal attention span and concentration.  Behavior appropriate. No anxious or depressed appearing.  Assessment    73 year old male PMH includes diabetes, high cholesterol, HTN, stroke, presents with: Chest pain: The patient presents with atypical symptoms for CAD, possibly GERD however he is diabetic, has a history of a stroke consequently he is high risk. I am concerned because the history might not be accurate thus the plan is:  Chest x-ray, stat troponin, BMP and CBC. Cardiology referral ER if chest pain resurface. GERD: Could be the explanation of his symptoms, start Protonix. See PCP in 2 weeks Recommend to have a translator for every medical visit All of the above was carefully discussed with the patient's wife, she verbalized understanding

## 2018-03-22 NOTE — Telephone Encounter (Signed)
Raquel Sarna, Client Service Rep from [facility] called a report of elevated Troponin I 1.01, she says it was verified with repeat analysis. Result read back and verified, collection date 03/22/18. Attempted to call the on call physician [phone], no answer, no VM set up. Attempted to call on call physician [phone], left VM to return call to the office [phone]. Will route to office high priority.

## 2018-03-22 NOTE — Progress Notes (Signed)
ANTICOAGULATION CONSULT NOTE - Initial Consult  Pharmacy Consult for Heparin Indication: chest pain/ACS  No Known Allergies  Patient Measurements: Height: 5\' 7"  (170.2 cm) Weight: 146 lb (66.2 kg) IBW/kg (Calculated) : 66.1  Vital Signs: Temp: 97.6 F (36.4 C) (01/27 2023) Temp Source: Oral (01/27 2023) BP: 156/87 (01/27 2047) Pulse Rate: 79 (01/27 2047)  Labs: Recent Labs    03/22/18 1524 03/22/18 2048  HGB 17.0 16.4  HCT 49.1 47.5  PLT 287 266  CREATININE 0.92  --   TROPONINI 1.01*  --     Estimated Creatinine Clearance: 65.9 mL/min (by C-G formula based on SCr of 0.92 mg/dL).   Medical History: Past Medical History:  Diagnosis Date  . Depression   . Diabetes mellitus without complication ([place])   . Hypertension   . Stroke [facility])     Assessment: 73 year old male presents with atypical symptoms for CAD, possibly GERD. Pharmacy consulted for heparin dosing. Patient only on Plavix PTA  Goal of Therapy:  Heparin level 0.3-0.7 units/ml Monitor platelets by anticoagulation protocol: Yes   Plan:  Give 3950 units bolus x 1 Start heparin infusion at 800 units/hr Check anti-Xa level in 6 hours and daily while on heparin Continue to monitor H&H and platelets  Alanda Slim, PharmD, [facility] Clinical Pharmacist Please see AMION for all Pharmacists' Contact Phone Numbers 03/22/2018, 9:12 PM

## 2018-03-22 NOTE — Patient Instructions (Addendum)
GO TO THE LAB : Get the blood work     GO TO THE FRONT DESK Schedule your next appointment   to see your primary doctor in 2 weeks from today  You need to bring a translator  Start Protonix: Take 1 before your dinner today Then take 1 tablet before every breakfast on an empty stomach  Go to the ER or call 911 if you have chest pain again    STOP BY THE FIRST FLOOR:  get the XR   We are referring you to see a cardiologist

## 2018-03-22 NOTE — ED Triage Notes (Signed)
Pt BIB [facility] for chest pain and an elevated troponin. EMS advised the pt was seen at [facility] Primary care this date for some chest pain that had been going on for a week. EMS advised the family received a call from his PCP that his troponin was elevated and he needed to get to the Emergency Department. EMS advised they gave 324 ASA and 1 SL nitro tablet with a reported relief in pain. EMS advised vital signs were stable and an IV was started as documented.   Pt reports some chest pain but is unable to describe same.

## 2018-03-22 NOTE — Telephone Encounter (Signed)
Per verbal from Debbrah Alar NP, pt should be evaluated in the ER right away and if pt is still having chest discomfort or pain, he should call 911. Spoke with pt's spouse. She states pt is still having chest discomfort / squeezing sensation. Advised her to call 911 and she voices understanding and gives verbal agreement that she will call 911.

## 2018-03-23 ENCOUNTER — Encounter (HOSPITAL_COMMUNITY)
Admission: EM | Disposition: A | Discharge status: Home / Self Care | Payer: Self-pay | Source: Home / Self Care | Attending: Cardiology

## 2018-03-23 ENCOUNTER — Inpatient Hospital Stay (HOSPITAL_COMMUNITY): Payer: Medicare Other

## 2018-03-23 ENCOUNTER — Encounter (HOSPITAL_COMMUNITY): Payer: Self-pay | Admitting: Interventional Cardiology

## 2018-03-23 DIAGNOSIS — I259 Chronic ischemic heart disease, unspecified: Secondary | ICD-10-CM | POA: Diagnosis present

## 2018-03-23 DIAGNOSIS — E119 Type 2 diabetes mellitus without complications: Secondary | ICD-10-CM | POA: Diagnosis not present

## 2018-03-23 DIAGNOSIS — K219 Gastro-esophageal reflux disease without esophagitis: Secondary | ICD-10-CM | POA: Diagnosis not present

## 2018-03-23 DIAGNOSIS — I10 Essential (primary) hypertension: Secondary | ICD-10-CM | POA: Diagnosis not present

## 2018-03-23 DIAGNOSIS — I214 Non-ST elevation (NSTEMI) myocardial infarction: Secondary | ICD-10-CM | POA: Diagnosis not present

## 2018-03-23 DIAGNOSIS — I119 Hypertensive heart disease without heart failure: Secondary | ICD-10-CM | POA: Diagnosis not present

## 2018-03-23 DIAGNOSIS — Z87891 Personal history of nicotine dependence: Secondary | ICD-10-CM | POA: Diagnosis not present

## 2018-03-23 DIAGNOSIS — I2510 Atherosclerotic heart disease of native coronary artery without angina pectoris: Secondary | ICD-10-CM | POA: Diagnosis not present

## 2018-03-23 DIAGNOSIS — Z8673 Personal history of transient ischemic attack (TIA), and cerebral infarction without residual deficits: Secondary | ICD-10-CM | POA: Diagnosis not present

## 2018-03-23 DIAGNOSIS — E785 Hyperlipidemia, unspecified: Secondary | ICD-10-CM | POA: Diagnosis not present

## 2018-03-23 DIAGNOSIS — Z955 Presence of coronary angioplasty implant and graft: Secondary | ICD-10-CM | POA: Diagnosis not present

## 2018-03-23 DIAGNOSIS — Z7902 Long term (current) use of antithrombotics/antiplatelets: Secondary | ICD-10-CM | POA: Diagnosis not present

## 2018-03-23 HISTORY — PX: CORONARY STENT INTERVENTION: CATH118234

## 2018-03-23 HISTORY — PX: LEFT HEART CATH AND CORONARY ANGIOGRAPHY: CATH118249

## 2018-03-23 LAB — PROTIME-INR
INR: 1.11
Prothrombin Time: 14.2 seconds (ref 11.4–15.2)

## 2018-03-23 LAB — HEPARIN LEVEL (UNFRACTIONATED): Heparin Unfractionated: 0.37 IU/mL (ref 0.30–0.70)

## 2018-03-23 LAB — CBC
HEMATOCRIT: 44.8 % (ref 39.0–52.0)
HEMATOCRIT: 44.8 % (ref 39.0–52.0)
Hemoglobin: 15.8 g/dL (ref 13.0–17.0)
Hemoglobin: 15.6 g/dL (ref 13.0–17.0)
MCH: 30.4 pg (ref 26.0–34.0)
MCH: 30.1 pg (ref 26.0–34.0)
MCHC: 35.3 g/dL (ref 30.0–36.0)
MCHC: 34.8 g/dL (ref 30.0–36.0)
MCV: 86.3 fL (ref 80.0–100.0)
MCV: 86.5 fL (ref 80.0–100.0)
Platelets: 283 10*3/uL (ref 150–400)
Platelets: 286 10*3/uL (ref 150–400)
RBC: 5.19 MIL/uL (ref 4.22–5.81)
RBC: 5.18 MIL/uL (ref 4.22–5.81)
RDW: 12.3 % (ref 11.5–15.5)
RDW: 12.3 % (ref 11.5–15.5)
WBC: 11.5 10*3/uL — ABNORMAL HIGH (ref 4.0–10.5)
WBC: 11.7 10*3/uL — ABNORMAL HIGH (ref 4.0–10.5)
nRBC: 0 % (ref 0.0–0.2)
nRBC: 0 % (ref 0.0–0.2)

## 2018-03-23 LAB — BASIC METABOLIC PANEL
Anion gap: 14 (ref 5–15)
BUN: 7 mg/dL — ABNORMAL LOW (ref 8–23)
CO2: 23 mmol/L (ref 22–32)
CREATININE: 0.81 mg/dL (ref 0.61–1.24)
Calcium: 8.5 mg/dL — ABNORMAL LOW (ref 8.9–10.3)
Chloride: 97 mmol/L — ABNORMAL LOW (ref 98–111)
GFR calc Af Amer: >60 mL/min (ref >60)
GFR calc non Af Amer: >60 mL/min (ref >60)
Glucose, Bld: 164 mg/dL — ABNORMAL HIGH (ref 70–99)
Potassium: 3.2 mmol/L — ABNORMAL LOW (ref 3.5–5.1)
SODIUM: 134 mmol/L — AB (ref 135–145)

## 2018-03-23 LAB — POCT ACTIVATED CLOTTING TIME
Activated Clotting Time: 323 seconds
Activated Clotting Time: 538 seconds

## 2018-03-23 LAB — I-STAT TROPONIN, ED: Troponin i, poc: 1.55 ng/mL (ref 0.00–0.08)

## 2018-03-23 LAB — HEMOGLOBIN A1C
Hgb A1c MFr Bld: 6.8 % — ABNORMAL HIGH (ref 4.8–5.6)
Mean Plasma Glucose: 148.46 mg/dL

## 2018-03-23 LAB — TROPONIN I
TROPONIN I: 4.94 ng/mL — AB (ref <0.03)
Troponin I: 3.95 ng/mL (ref <0.03)
Troponin I: 30.32 ng/mL (ref <0.03)
Troponin I: 15.5 ng/mL (ref <0.03)

## 2018-03-23 LAB — LIPID PANEL
Cholesterol: 128 mg/dL (ref 0–200)
HDL: 32 mg/dL — ABNORMAL LOW (ref >40)
LDL Cholesterol: 81 mg/dL (ref 0–99)
TRIGLYCERIDES: 76 mg/dL (ref <150)
Total CHOL/HDL Ratio: 4 RATIO
VLDL: 15 mg/dL (ref 0–40)

## 2018-03-23 LAB — ECHOCARDIOGRAM COMPLETE
Height: 67 in
Weight: 2336 oz

## 2018-03-23 LAB — CBG MONITORING, ED: Glucose-Capillary: 166 mg/dL — ABNORMAL HIGH (ref 70–99)

## 2018-03-23 SURGERY — LEFT HEART CATH AND CORONARY ANGIOGRAPHY
Anesthesia: LOCAL

## 2018-03-23 MED ORDER — HEPARIN (PORCINE) IN NACL 1000-0.9 UT/500ML-% IV SOLN
INTRAVENOUS | Status: AC
  Filled 2018-03-23: qty 1000

## 2018-03-23 MED ORDER — SODIUM CHLORIDE 0.9% FLUSH: 3.0000 mL | Freq: Two times a day (BID) | INTRAVENOUS | Status: DC

## 2018-03-23 MED ORDER — ACETAMINOPHEN 325 MG PO TABS: 650.0000 mg | ORAL_TABLET | ORAL | Status: DC | PRN

## 2018-03-23 MED ORDER — CLOPIDOGREL BISULFATE 300 MG PO TABS
ORAL_TABLET | ORAL | Status: AC
  Filled 2018-03-23: qty 1

## 2018-03-23 MED ORDER — TIROFIBAN (AGGRASTAT) BOLUS VIA INFUSION
INTRAVENOUS | Status: DC | PRN
  Administered 2018-03-23: 1655 ug via INTRAVENOUS

## 2018-03-23 MED ORDER — MIRTAZAPINE 30 MG PO TABS
30.0000 mg | ORAL_TABLET | Freq: Every day | ORAL | Status: DC
  Administered 2018-03-23 – 2018-03-24 (×2): 30 mg via ORAL
  Filled 2018-03-23 (×2): qty 1

## 2018-03-23 MED ORDER — MIDAZOLAM HCL 2 MG/2ML IJ SOLN
INTRAMUSCULAR | Status: DC | PRN
  Administered 2018-03-23 (×2): 1 mg via INTRAVENOUS

## 2018-03-23 MED ORDER — TIROFIBAN HCL IN NACL 5-0.9 MG/100ML-% IV SOLN
INTRAVENOUS | Status: AC
  Filled 2018-03-23: qty 100

## 2018-03-23 MED ORDER — LIDOCAINE HCL (PF) 1 % IJ SOLN
INTRAMUSCULAR | Status: AC
  Filled 2018-03-23: qty 30

## 2018-03-23 MED ORDER — CLOPIDOGREL BISULFATE 300 MG PO TABS
ORAL_TABLET | ORAL | Status: DC | PRN
  Administered 2018-03-23: 300 mg via ORAL

## 2018-03-23 MED ORDER — HEPARIN SODIUM (PORCINE) 1000 UNIT/ML IJ SOLN
INTRAMUSCULAR | Status: DC | PRN
  Administered 2018-03-23: 3500 [IU] via INTRAVENOUS
  Administered 2018-03-23: 5000 [IU] via INTRAVENOUS

## 2018-03-23 MED ORDER — SODIUM CHLORIDE 0.9 % IV SOLN: 250.0000 mL | INTRAVENOUS | Status: DC | PRN

## 2018-03-23 MED ORDER — ONDANSETRON HCL 4 MG/2ML IJ SOLN: 4.0000 mg | Freq: Four times a day (QID) | INTRAMUSCULAR | Status: DC | PRN

## 2018-03-23 MED ORDER — POTASSIUM CHLORIDE CRYS ER 20 MEQ PO TBCR
40.0000 meq | EXTENDED_RELEASE_TABLET | ORAL | Status: AC
  Administered 2018-03-23: 40 meq via ORAL
  Filled 2018-03-23: qty 2

## 2018-03-23 MED ORDER — VERAPAMIL HCL 2.5 MG/ML IV SOLN
INTRAVENOUS | Status: AC
  Filled 2018-03-23: qty 2

## 2018-03-23 MED ORDER — SODIUM CHLORIDE 0.9% FLUSH: 3.0000 mL | INTRAVENOUS | Status: DC | PRN

## 2018-03-23 MED ORDER — SODIUM CHLORIDE 0.9 % WEIGHT BASED INFUSION
1.0000 mL/kg/h | INTRAVENOUS | Status: DC
  Administered 2018-03-23: 1 mL/kg/h via INTRAVENOUS

## 2018-03-23 MED ORDER — ATORVASTATIN CALCIUM 80 MG PO TABS
80.0000 mg | ORAL_TABLET | Freq: Every day | ORAL | Status: DC
  Administered 2018-03-23 – 2018-03-24 (×2): 80 mg via ORAL
  Filled 2018-03-23: qty 1

## 2018-03-23 MED ORDER — IOHEXOL 350 MG/ML SOLN
INTRAVENOUS | Status: DC | PRN
  Administered 2018-03-23: 130 mL via INTRA_ARTERIAL

## 2018-03-23 MED ORDER — LIDOCAINE HCL (PF) 1 % IJ SOLN
INTRAMUSCULAR | Status: DC | PRN
  Administered 2018-03-23: 2 mL

## 2018-03-23 MED ORDER — TIROFIBAN HCL IN NACL 5-0.9 MG/100ML-% IV SOLN
INTRAVENOUS | Status: DC | PRN
  Administered 2018-03-23: 0.15 ug/kg/min via INTRAVENOUS

## 2018-03-23 MED ORDER — ANGIOPLASTY BOOK
Freq: Once | Status: AC
  Administered 2018-03-23: 22:00:00 1
  Filled 2018-03-23: qty 1

## 2018-03-23 MED ORDER — VERAPAMIL HCL 2.5 MG/ML IV SOLN
INTRAVENOUS | Status: DC | PRN
  Administered 2018-03-23: 10 mL via INTRA_ARTERIAL

## 2018-03-23 MED ORDER — LABETALOL HCL 5 MG/ML IV SOLN: 10.0000 mg | INTRAVENOUS | Status: AC | PRN

## 2018-03-23 MED ORDER — HYDRALAZINE HCL 20 MG/ML IJ SOLN: 5.0000 mg | INTRAMUSCULAR | Status: AC | PRN

## 2018-03-23 MED ORDER — FENTANYL CITRATE (PF) 100 MCG/2ML IJ SOLN
INTRAMUSCULAR | Status: AC
  Filled 2018-03-23: qty 2

## 2018-03-23 MED ORDER — SODIUM CHLORIDE 0.9 % IV SOLN
INTRAVENOUS | Status: AC | PRN
  Administered 2018-03-23: 250 mL via INTRAVENOUS

## 2018-03-23 MED ORDER — ASPIRIN 81 MG PO CHEW
81.0000 mg | CHEWABLE_TABLET | ORAL | Status: AC
  Administered 2018-03-23: 81 mg via ORAL
  Filled 2018-03-23: qty 1

## 2018-03-23 MED ORDER — SODIUM CHLORIDE 0.9 % IV SOLN: INTRAVENOUS | Status: AC

## 2018-03-23 MED ORDER — MIDAZOLAM HCL 2 MG/2ML IJ SOLN
INTRAMUSCULAR | Status: AC
  Filled 2018-03-23: qty 2

## 2018-03-23 MED ORDER — FENTANYL CITRATE (PF) 100 MCG/2ML IJ SOLN
INTRAMUSCULAR | Status: DC | PRN
  Administered 2018-03-23 (×2): 25 ug via INTRAVENOUS

## 2018-03-23 MED ORDER — HEPARIN (PORCINE) IN NACL 1000-0.9 UT/500ML-% IV SOLN
INTRAVENOUS | Status: DC | PRN
  Administered 2018-03-23 (×2): 500 mL

## 2018-03-23 MED ORDER — SODIUM CHLORIDE 0.9 % WEIGHT BASED INFUSION
3.0000 mL/kg/h | INTRAVENOUS | Status: DC
  Administered 2018-03-23: 3 mL/kg/h via INTRAVENOUS

## 2018-03-23 MED ORDER — CLOPIDOGREL BISULFATE 75 MG PO TABS
75.0000 mg | ORAL_TABLET | Freq: Every day | ORAL | Status: DC
  Administered 2018-03-23: 75 mg via ORAL
  Filled 2018-03-23: qty 1

## 2018-03-23 MED ORDER — NITROGLYCERIN 1 MG/10 ML FOR IR/CATH LAB
INTRA_ARTERIAL | Status: AC
  Filled 2018-03-23: qty 10

## 2018-03-23 MED ORDER — METOPROLOL TARTRATE 25 MG PO TABS
25.0000 mg | ORAL_TABLET | Freq: Two times a day (BID) | ORAL | Status: DC
  Administered 2018-03-23 – 2018-03-25 (×5): 25 mg via ORAL
  Filled 2018-03-23 (×5): qty 1

## 2018-03-23 MED ORDER — PANTOPRAZOLE SODIUM 40 MG PO TBEC
40.0000 mg | DELAYED_RELEASE_TABLET | Freq: Every day | ORAL | Status: DC
  Administered 2018-03-23 – 2018-03-25 (×3): 40 mg via ORAL
  Filled 2018-03-23 (×3): qty 1

## 2018-03-23 MED ORDER — ASPIRIN 81 MG PO CHEW: 81.0000 mg | CHEWABLE_TABLET | Freq: Every day | ORAL | Status: DC

## 2018-03-23 MED ORDER — ASPIRIN EC 81 MG PO TBEC
81.0000 mg | DELAYED_RELEASE_TABLET | Freq: Every day | ORAL | Status: DC
  Administered 2018-03-24 – 2018-03-25 (×2): 81 mg via ORAL
  Filled 2018-03-23 (×2): qty 1

## 2018-03-23 MED ORDER — CLOPIDOGREL BISULFATE 75 MG PO TABS
75.0000 mg | ORAL_TABLET | Freq: Every day | ORAL | Status: DC
  Administered 2018-03-24: 75 mg via ORAL
  Filled 2018-03-23: qty 1

## 2018-03-23 MED ORDER — HEART ATTACK BOUNCING BOOK
Freq: Once | Status: AC
  Administered 2018-03-23: 1
  Filled 2018-03-23: qty 1

## 2018-03-23 MED ORDER — NITROGLYCERIN 1 MG/10 ML FOR IR/CATH LAB
INTRA_ARTERIAL | Status: DC | PRN
  Administered 2018-03-23: 300 ug via INTRA_ARTERIAL
  Administered 2018-03-23: 200 ug via INTRACORONARY

## 2018-03-23 SURGICAL SUPPLY — 20 items
BALLN SAPPHIRE 2.5X15 (BALLOONS) ×2
BALLN SAPPHIRE ~~LOC~~ 3.5X15 (BALLOONS) ×2 IMPLANT
BALLOON SAPPHIRE 2.5X15 (BALLOONS) ×1 IMPLANT
CATH 5FR JL3.5 JR4 ANG PIG MP (CATHETERS) ×2 IMPLANT
CATH EXTRAC PRONTO 5.5F 138CM (CATHETERS) ×2 IMPLANT
CATH EXTRAC PRONTO LP 6F RND (CATHETERS) IMPLANT
CATH LAUNCHER 6FR EBU3.5 (CATHETERS) ×2 IMPLANT
DEVICE RAD COMP TR BAND LRG (VASCULAR PRODUCTS) ×2 IMPLANT
GLIDESHEATH SLEND SS 6F .021 (SHEATH) ×2 IMPLANT
GUIDEWIRE INQWIRE 1.5J.035X260 (WIRE) ×1 IMPLANT
INQWIRE 1.5J .035X260CM (WIRE) ×2
KIT ENCORE 26 ADVANTAGE (KITS) ×2 IMPLANT
KIT HEART LEFT (KITS) ×2 IMPLANT
KIT HEMO VALVE WATCHDOG (MISCELLANEOUS) ×2 IMPLANT
PACK CARDIAC CATHETERIZATION (CUSTOM PROCEDURE TRAY) ×2 IMPLANT
STENT SYNERGY DES 3X24 (Permanent Stent) ×2 IMPLANT
TRANSDUCER W/STOPCOCK (MISCELLANEOUS) ×2 IMPLANT
TUBING CIL FLEX 10 FLL-RA (TUBING) ×2 IMPLANT
WIRE ASAHI PROWATER 180CM (WIRE) ×2 IMPLANT
WIRE COUGAR XT STRL 190CM (WIRE) ×2 IMPLANT

## 2018-03-23 NOTE — Progress Notes (Signed)
  Echocardiogram 2D Echocardiogram has been performed.  Bryan Henderson 03/23/2018, 9:08 AM

## 2018-03-23 NOTE — H&P (View-Only) (Signed)
   Pt seen and examined in the ED for possible cath. Pt continues to have 2/10 chest pain with trop elevation to 4.95. Plan is for cath today for further evaluation. Pt vitals and labs stable. Spoke with pt wife about procedure based on limited [place].  The risks and benefits of a cardiac catheterization including, but not limited to, death, stroke, MI, kidney damage and bleeding were discussed with the patient and family who indicates understanding and agrees to proceed.    Kathyrn Drown NP-C [facility] Pager: [phone]   I personally managed see the patient today.  He says he was having "a little bit of pain".  He was having his echocardiogram done, and on brief bedside review it appears to show some subtle inferoapical hypokinesis. Troponin elevation of 4.95 with intermittent chest pain.  Probably should go to the Cath Lab sooner than later. Unfortunately his comprehension of English is not very good.  I was not able to use an interpreter because of the echocardiogram ongoing.  I spent 15 minutes on the telephone with his wife who seemed to understand the procedure, and had more questions about the proceedings of how the procedure is done and then of the procedure itself.  I tried to make sure that she understood the risks and benefits of the procedure explained to the patient and we will ensure that this is explained prior to his cath.  His blood pressures are relatively stable now as is his heart rate.  He seems relatively comfortable on my exam. He has been followed by a cardiologist prior to moving to [place].  They originally moved from [place] to [place] and then [place] here.  They have not yet established care here.  He was being seen by cardiologist simply for hypertension and distant history of mild strokes.  He is already on Plavix. I reviewed the plan from the overnight physician, Dr.Truby.  Agree with her plan.  Further titration of medications following  cath.   Glenetta Hew, M.D., M.S. Interventional Cardiologist   Pager # [phone] Phone # [phone] [address] [place], [place] [address]

## 2018-03-23 NOTE — ED Notes (Signed)
Obtained consent for procedure. Will transport pt to cath lab 1

## 2018-03-23 NOTE — ED Notes (Signed)
Pt's wife at bedside and will take all personal belongings with her.

## 2018-03-23 NOTE — H&P (Signed)
Cardiology Admission History and Physical:   Patient ID: Bryan Henderson MRN: [id number]; DOB: [birth year]   Admission date: 03/22/2018  Primary Care Provider: Shelda Pal, DO Primary Cardiologist: No primary care provider on file.  Primary Electrophysiologist:  None   Chief Complaint:  Chest pain  Wife Nester Bachus: [phone].   Patient Profile:   Bryan Henderson is a 73 y.o. male former smoker with HTN, prior CVA on plavix who presents with a few days of epigastric discomfort found to have elevated biomarkers concerning for NSTEMI.   History of Present Illness:   Briefly, at his baseline Bryan Henderson exercises 4-5 days a week walking on the treadmill and using light weights. He is usually asymptomatic with this. In speaking to the patient and his wife, he has noted increased epigastric discomfort over the last few days. It seemed to be associated with timing of food / spicy food, was not exertional, and did not radiate. His wife (who also has GERD) recommended that he trial a bland diet and a PPI, but this did not appear to help his symptoms. Sunday evening he ate a particularly spicy soup, and developed epigastric and substernal discomfort that did not abate. He sought medical attention on Monday afternoon and described his chest burning, which prompted an EKG and checking enzymes. His initial EKG was without Q waves or evidence of ongoing ischemia, but his outpatient troponin was elevated to 1.0. He was contacted and activated EMS to bring him to the ER.   Upon arrival, he was still complaining of 1-2/10 chest discomfort. Repeat troponin 1.0 -> 1.5. Cardiology was consulted given abnormal cardiac enzymes.   Of note, patient had an extensive work up for GERD including endoscopy with H. Pylori testing last year that was reportedly unremarkable.   Past Medical History:  Diagnosis Date  . Depression   . Diabetes mellitus without complication ([place])   . Hypertension   . Stroke [facility])      History reviewed. No pertinent surgical history.   Medications Prior to Admission: Prior to Admission medications   Medication Sig Start Date End Date Taking? Authorizing Provider  amLODipine (NORVASC) 10 MG tablet Take 1 tablet (10 mg total) by mouth daily. 01/13/18  Yes Shelda Pal, DO  atorvastatin (LIPITOR) 40 MG tablet Take 1 tablet (40 mg total) by mouth daily. 01/13/18  Yes Shelda Pal, DO  b complex vitamins capsule Take 1 capsule by mouth daily.   Yes [provider]  clopidogrel (PLAVIX) 75 MG tablet Take 1 tablet (75 mg total) by mouth daily. 01/13/18  Yes Shelda Pal, DO  hydrochlorothiazide (MICROZIDE) 12.5 MG capsule Take 1 capsule (12.5 mg total) by mouth daily. 01/13/18  Yes Shelda Pal, DO  metFORMIN (GLUCOPHAGE) 500 MG tablet Take 1 tablet (500 mg total) by mouth 2 (two) times daily with a meal. 01/13/18  Yes Wendling, Crosby Oyster, DO  metoprolol tartrate (LOPRESSOR) 25 MG tablet Take 1 tablet (25 mg total) by mouth 2 (two) times daily. 01/13/18  Yes Shelda Pal, DO  mirtazapine (REMERON) 30 MG tablet Take 1 tablet (30 mg total) by mouth at bedtime. 01/18/18  Yes Shelda Pal, DO  Multiple Vitamin (MULTIVITAMIN) tablet Take 1 tablet by mouth daily.   Yes [provider]  Omega-3 Fatty Acids (FISH OIL) 1000 MG CAPS Take 1 capsule by mouth daily.   Yes [provider]  pantoprazole (PROTONIX) 40 MG tablet Take 1 tablet (40 mg total) by mouth daily. 03/22/18  Yes Paz, Alda Berthold, MD  vitamin C (ASCORBIC ACID) 500 MG tablet Take 500 mg by mouth daily.   Yes [provider]     Allergies:   No Known Allergies  Social History:   Social History   Socioeconomic History  . Marital status: Married    Spouse name: Not on file  . Number of children: Not on file  . Years of education: Not on file  . Highest education level: Not on file  Occupational History  . Not on file   Social Needs  . Financial resource strain: Not on file  . Food insecurity:    Worry: Not on file    Inability: Not on file  . Transportation needs:    Medical: Not on file    Non-medical: Not on file  Tobacco Use  . Smoking status: Never Smoker  . Smokeless tobacco: Never Used  Substance and Sexual Activity  . Alcohol use: Never    Frequency: Never  . Drug use: Never  . Sexual activity: Not on file  Lifestyle  . Physical activity:    Days per week: Not on file    Minutes per session: Not on file  . Stress: Not on file  Relationships  . Social connections:    Talks on phone: Not on file    Gets together: Not on file    Attends religious service: Not on file    Active member of club or organization: Not on file    Attends meetings of clubs or organizations: Not on file    Relationship status: Not on file  . Intimate partner violence:    Fear of current or ex partner: Not on file    Emotionally abused: Not on file    Physically abused: Not on file    Forced sexual activity: Not on file  Other Topics Concern  . Not on file  Social History Narrative  . Not on file    Family History:   The patient's family history is not on file.    Review of Systems: [y] = yes, [ ]  = no     General: Weight gain [ ] ; Weight loss [ ] ; Anorexia [ ] ; Fatigue [ ] ; Fever [ ] ; Chills [ ] ; Weakness [ ]    Cardiac: Chest pain/pressure Blue.Reese ]; Resting SOB [ ] ; Exertional SOB [ ] ; Orthopnea [ ] ; Pedal Edema [ ] ; Palpitations [ ] ; Syncope [ ] ; Presyncope [ ] ; Paroxysmal nocturnal dyspnea[ ]    Pulmonary: Cough [ ] ; Wheezing[ ] ; Hemoptysis[ ] ; Sputum [ ] ; Snoring [ ]    GI: Vomiting[ ] ; Dysphagia[ ] ; Melena[ ] ; Hematochezia [ ] ; Heartburn[y ]; Abdominal pain [ ] ; Constipation [ ] ; Diarrhea [ ] ; BRBPR [ ]    GU: Hematuria[ ] ; Dysuria [ ] ; Nocturia[ ]    Vascular: Pain in legs with walking [ ] ; Pain in feet with lying flat [ ] ; Non-healing sores [ ] ; Stroke [ ] ; TIA [ ] ; Slurred speech [ ] ;   Neuro:  Headaches[ ] ; Vertigo[ ] ; Seizures[ ] ; Paresthesias[ ] ;Blurred vision [ ] ; Diplopia [ ] ; Vision changes [ ]    Ortho/Skin: Arthritis [ ] ; Joint pain [ ] ; Muscle pain [ ] ; Joint swelling [ ] ; Back Pain [ ] ; Rash [ ]    Psych: Depression[ ] ; Anxiety[ ]    Heme: Bleeding problems [ ] ; Clotting disorders [ ] ; Anemia [ ]    Endocrine: Diabetes [ ] ; Thyroid dysfunction[ ]   Physical Exam/Data:   Vitals:   03/23/18 0015 03/23/18 0030  03/23/18 0100 03/23/18 0115  BP: 131/75 (!) 147/81 119/71 120/78  Pulse: 80 86    Resp: (!) 24 (!) 25 17 (!) 26  Temp:      TempSrc:      SpO2: 93% 94%    Weight:      Height:        Intake/Output Summary (Last 24 hours) at 03/23/2018 0203 Last data filed at 03/22/2018 2015 Gross per 24 hour  Intake 10 ml  Output -  Net 10 ml   Filed Weights   03/22/18 2027  Weight: 66.2 kg   Body mass index is 22.87 kg/m.  General:  Well nourished, well developed, in no acute distress HEENT: normal Lymph: no adenopathy Neck: no JVD Endocrine:  No thryomegaly Vascular: No carotid bruits; FA pulses 2+ bilaterally without bruits. 2+ radial pulses.  Cardiac:  normal S1, S2; RRR; no murmur.  Lungs:  clear to auscultation bilaterally, no wheezing, rhonchi or rales  Abd: soft, nontender, no hepatomegaly  Ext: no LE edema. Warm and well perfused.  Musculoskeletal:  No deformities, BUE and BLE strength normal and equal Skin: warm and dry  Neuro:  CNs 2-12 intact, no focal abnormalities noted Psych:  Normal affect   EKG:  The ECG that was done demonstrates low voltage, poor R wave progression. No active ischemic changes.   Relevant CV Studies: TTE 2018: LVEF 70% Mild AI Dilated aortic root to 2.1 cm Negative bubble study.   Laboratory Data:  Chemistry Recent Labs  Lab 03/22/18 1524 03/22/18 2048  NA 135 132*  K 4.1 3.3*  CL 96* 93*  CO2 30 25  GLUCOSE 194* 182*  BUN 11 9  CREATININE 0.92 0.80  CALCIUM 9.6 8.8*  GFRNONAA  --  >60  GFRAA  --  >60    ANIONGAP  --  14    Recent Labs  Lab 03/22/18 2048  PROT 7.1  ALBUMIN 3.8  AST 50*  ALT 27  ALKPHOS 53  BILITOT 0.8   Hematology Recent Labs  Lab 03/22/18 1524 03/22/18 2048  WBC 6.7 7.2  RBC 5.44 5.42  HGB 17.0 16.4  HCT 49.1 47.5  MCV 90.3 87.6  MCH 31.3 30.3  MCHC 34.6 34.5  RDW 12.9 12.4  PLT 287 266   Cardiac Enzymes Recent Labs  Lab 03/22/18 1524  TROPONINI 1.01*    Recent Labs  Lab 03/22/18 2055 03/23/18 0001  TROPIPOC 0.91* 1.55*    BNPNo results for input(s): BNP, PROBNP in the last 168 hours.  DDimer No results for input(s): DDIMER in the last 168 hours.  Radiology/Studies:  Dg Chest Portable 1 View  Result Date: 03/22/2018 CLINICAL DATA:  Chest pain. EXAM: PORTABLE CHEST 1 VIEW COMPARISON:  Chest x-ray from same day at 15:37. FINDINGS: The heart size and mediastinal contours are within normal limits. Normal pulmonary vascularity. No focal consolidation, pleural effusion, or pneumothorax. No acute osseous abnormality. IMPRESSION: No active disease. Electronically Signed   By: Titus Dubin M.D.   On: 03/22/2018 21:28    Assessment and Plan:   Bryan Henderson Is a 73 year old man with HTN and prior CVA (on plavix) who presents with days of intermittent epigastric pain and was found to have positive cardiac enzymes suggestive of myocardial injury. At the very least, his chest discomfort is atypical and sounds more like GERD or GI discomfort than angina pain (non-exertional, associated with food, etc). However, Bryan Henderson does have risk factors for CAD (HTN, DM, prior CVA) with  a HEART score of 5. Will need to r/o ischemia as contribution prior to attributing his symptoms to alternative etiology.   #c/f NSTEMI -- Cycle troponins q6 hours x 3 -- Heparin gtt for ACS -- ASA 325 -> 81mg  daily -- Continue home plavix (was rx'd s/p CVA previously) -- Will check lipids and a1c in the AM for risk stratification -- INCREASE atorvastatin to high dose (80mg  daily) --  Continue home beta blocker (25mg  BID) -- Nitro gtt now - titrate to pain free.  -- TTE ordered for the AM.  -- NPO @ MN for possible LHC  #DM2 -- Check a1c -- ISS PRN for hyperglycemia -- Hold metformin in s/o possible cath.   #HTN -- Hold amlodipine while on nitro gtt -- Continue metoprolol as above.   Severity of Illness: The appropriate patient status for this patient is INPATIENT. Inpatient status is judged to be reasonable and necessary in order to provide the required intensity of service to ensure the patient's safety. The patient's presenting symptoms, physical exam findings, and initial radiographic and laboratory data in the context of their chronic comorbidities is felt to place them at high risk for further clinical deterioration. Furthermore, it is not anticipated that the patient will be medically stable for discharge from the hospital within 2 midnights of admission. The following factors support the patient status of inpatient.   " The patient's presenting symptoms include NSTEMI. " The worrisome physical exam findings include n/a " The initial radiographic and laboratory data are worrisome because of elevated troponin " The chronic co-morbidities include DM2, HTN, HLD.   * I certify that at the point of admission it is my clinical judgment that the patient will require inpatient hospital care spanning beyond 2 midnights from the point of admission due to high intensity of service, high risk for further deterioration and high frequency of surveillance required.*    For questions or updates, please contact [place] Please consult [email or web address] for contact info under    Signed, Milus Banister, MD  03/23/2018 2:03 AM

## 2018-03-23 NOTE — Progress Notes (Signed)
Patient arrived from cath lab holding. Right radial site with bruising noted proximal to site. Patient instructed to keep right arm on pillow and not move it to prevent bleeding from cath site. Wife reported patient constantly moving extremity, patient stated his wife yells at him when he moves his arm. Reviewed need to keep arm from moving and bleeding risk with poor results. Arm board secured to posterior of forearm and hand with coban, allowing good visualization of site. Patient cooperative and agreeable to board being on arm. Continues to use cell phone but no bleeding noted and site secure with armboard. No further bleeding noted and TR band deflated per protocol. Site unchanged and remains with proximal bruising, level 1. Monitored remainder of shift. Dressing replaced TR band and Dressing remains dry and intact.

## 2018-03-23 NOTE — ED Notes (Signed)
Dr Dayna Barker informed of troponin 1.55

## 2018-03-23 NOTE — Progress Notes (Addendum)
   Pt seen and examined in the ED for possible cath. Pt continues to have 2/10 chest pain with trop elevation to 4.95. Plan is for cath today for further evaluation. Pt vitals and labs stable. Spoke with pt wife about procedure based on limited [place].  The risks and benefits of a cardiac catheterization including, but not limited to, death, stroke, MI, kidney damage and bleeding were discussed with the patient and family who indicates understanding and agrees to proceed.    Kathyrn Drown NP-C [facility] Pager: [phone]   I personally managed see the patient today.  He says he was having "a little bit of pain".  He was having his echocardiogram done, and on brief bedside review it appears to show some subtle inferoapical hypokinesis. Troponin elevation of 4.95 with intermittent chest pain.  Probably should go to the Cath Lab sooner than later. Unfortunately his comprehension of English is not very good.  I was not able to use an interpreter because of the echocardiogram ongoing.  I spent 15 minutes on the telephone with his wife who seemed to understand the procedure, and had more questions about the proceedings of how the procedure is done and then of the procedure itself.  I tried to make sure that she understood the risks and benefits of the procedure explained to the patient and we will ensure that this is explained prior to his cath.  His blood pressures are relatively stable now as is his heart rate.  He seems relatively comfortable on my exam. He has been followed by a cardiologist prior to moving to [place].  They originally moved from [place] to [place] and then [place] here.  They have not yet established care here.  He was being seen by cardiologist simply for hypertension and distant history of mild strokes.  He is already on Plavix. I reviewed the plan from the overnight physician, Dr.Truby.  Agree with her plan.  Further titration of medications following  cath.   Glenetta Hew, M.D., M.S. Interventional Cardiologist   Pager # [phone] Phone # [phone] [address] [place], [place] [address]

## 2018-03-23 NOTE — Interval H&P Note (Signed)
Cath Lab Visit (complete for each Cath Lab visit)  Clinical Evaluation Leading to the Procedure:   ACS: Yes.    Non-ACS:    Anginal Classification: CCS IV  Anti-ischemic medical therapy: Minimal Therapy (1 class of medications)  Non-Invasive Test Results: No non-invasive testing performed  Prior CABG: No previous CABG      History and Physical Interval Note:  03/23/2018 10:04 AM  Lelon Huh  has presented today for surgery, with the diagnosis of cp  The various methods of treatment have been discussed with the patient and family. After consideration of risks, benefits and other options for treatment, the patient has consented to  Procedure(s): LEFT HEART CATH AND CORONARY ANGIOGRAPHY (N/A) as a surgical intervention .  The patient's history has been reviewed, patient examined, no change in status, stable for surgery.  I have reviewed the patient's chart and labs.  Questions were answered to the patient's satisfaction.     Larae Grooms

## 2018-03-23 NOTE — Progress Notes (Signed)
ANTICOAGULATION CONSULT NOTE - Follow Up Consult  Pharmacy Consult for heparin Indication: chest pain/ACS  Labs: Recent Labs    03/22/18 1524 03/22/18 2048 03/23/18 0259 03/23/18 0530 03/23/18 0531  HGB 17.0 16.4 15.8  --  15.6  HCT 49.1 47.5 44.8  --  44.8  PLT 287 266 283  --  286  LABPROT  --  13.4  --   --  14.2  INR  --  1.03  --   --  1.11  HEPARINUNFRC  --   --   --  0.37  --   CREATININE 0.92 0.80  --   --   --   TROPONINI 1.01*  --  3.95*  --   --     Assessment/Plan:  73yo male therapeutic on heparin with initial dosing for r/o ACS. Will continue gtt at current rate and confirm stable with additional level.   Wynona Neat, PharmD, BCPS  03/23/2018,6:08 AM

## 2018-03-24 DIAGNOSIS — Z8673 Personal history of transient ischemic attack (TIA), and cerebral infarction without residual deficits: Secondary | ICD-10-CM

## 2018-03-24 DIAGNOSIS — I10 Essential (primary) hypertension: Secondary | ICD-10-CM

## 2018-03-24 DIAGNOSIS — E785 Hyperlipidemia, unspecified: Secondary | ICD-10-CM

## 2018-03-24 LAB — BASIC METABOLIC PANEL
Anion gap: 9 (ref 5–15)
BUN: 10 mg/dL (ref 8–23)
CO2: 21 mmol/L — ABNORMAL LOW (ref 22–32)
Calcium: 7.9 mg/dL — ABNORMAL LOW (ref 8.9–10.3)
Chloride: 105 mmol/L (ref 98–111)
Creatinine, Ser: 0.99 mg/dL (ref 0.61–1.24)
GFR calc Af Amer: >60 mL/min (ref >60)
GFR calc non Af Amer: >60 mL/min (ref >60)
Glucose, Bld: 130 mg/dL — ABNORMAL HIGH (ref 70–99)
POTASSIUM: 3.8 mmol/L (ref 3.5–5.1)
Sodium: 135 mmol/L (ref 135–145)

## 2018-03-24 LAB — CBC
HCT: 39 % (ref 39.0–52.0)
Hemoglobin: 13.7 g/dL (ref 13.0–17.0)
MCH: 30.8 pg (ref 26.0–34.0)
MCHC: 35.1 g/dL (ref 30.0–36.0)
MCV: 87.6 fL (ref 80.0–100.0)
Platelets: 247 10*3/uL (ref 150–400)
RBC: 4.45 MIL/uL (ref 4.22–5.81)
RDW: 12.6 % (ref 11.5–15.5)
WBC: 9.9 10*3/uL (ref 4.0–10.5)
nRBC: 0 % (ref 0.0–0.2)

## 2018-03-24 LAB — TROPONIN I: Troponin I: 11.01 ng/mL (ref <0.03)

## 2018-03-24 LAB — GLUCOSE, CAPILLARY
GLUCOSE-CAPILLARY: 119 mg/dL — AB (ref 70–99)
Glucose-Capillary: 115 mg/dL — ABNORMAL HIGH (ref 70–99)
Glucose-Capillary: 148 mg/dL — ABNORMAL HIGH (ref 70–99)
Glucose-Capillary: 141 mg/dL — ABNORMAL HIGH (ref 70–99)

## 2018-03-24 MED ORDER — TICAGRELOR 90 MG PO TABS
180.0000 mg | ORAL_TABLET | Freq: Once | ORAL | Status: AC
  Administered 2018-03-24: 20:00:00 180 mg via ORAL
  Filled 2018-03-24: qty 2

## 2018-03-24 MED ORDER — LIVING WELL WITH DIABETES BOOK
Freq: Once | Status: AC
  Administered 2018-03-24: 11:00:00
  Filled 2018-03-24: qty 1

## 2018-03-24 MED ORDER — TICAGRELOR 90 MG PO TABS
90.0000 mg | ORAL_TABLET | Freq: Two times a day (BID) | ORAL | Status: DC
  Administered 2018-03-25: 90 mg via ORAL
  Filled 2018-03-24: qty 1

## 2018-03-24 NOTE — Progress Notes (Signed)
CARDIAC REHAB PHASE I   PRE:  Rate/Rhythm: 75 SR  BP:  Supine:   Sitting: 101/74  Standing:    SaO2:   MODE:  Ambulation: 500 ft   POST:  Rate/Rhythm: 78 SR  BP:  Supine:   Sitting: 125/58  Standing:    SaO2:  0900-0925 Pt walked 500 ft on RA with steady gait and no CP. Will educate when wife arrives.   Graylon Good, RN BSN  03/24/2018 9:24 AM

## 2018-03-24 NOTE — Progress Notes (Signed)
[id number] Wife who is retired Therapist, sports is here for ed. Pt does not want to use interpretive services as he stated he understood and wife is present to reinforce ed. When I walked in room wife was using MI booklet to teach her husband. Both voiced understanding of ed. Reviewed importance of antiplatelet with stent. Pt was already on plavix prior to MI and this concerns wife . She stated she discussed with cardiologist and may be switching med. I reviewed brilinta also in case pt is switched to this. Reviewed MI restrictions, NTG use, ex ed, carb counting and gave heart healthy diet, and CRP 2. Referred to [place] CRP 2. Graylon Good RN BSN 03/24/2018 11:45 AM

## 2018-03-24 NOTE — Progress Notes (Signed)
Patient ambulating in hall with steady gait. No s/s intolerance with activity.

## 2018-03-24 NOTE — Progress Notes (Addendum)
Progress Note  Patient Name: Bryan Henderson Date of Encounter: 03/24/2018  Primary Cardiologist: Shirlee More, MD  Subjective   Pt denies recurrent chest pain however difficult to follow given significant language barrier. Will obtain interpreter device for communication.   Inpatient Medications    Scheduled Meds: . aspirin EC  81 mg Oral Daily  . atorvastatin  80 mg Oral q1800  . clopidogrel  75 mg Oral Q breakfast  . metoprolol tartrate  25 mg Oral BID  . mirtazapine  30 mg Oral QHS  . pantoprazole  40 mg Oral Daily  . sodium chloride flush  3 mL Intravenous Once  . sodium chloride flush  3 mL Intravenous Q12H   Continuous Infusions: . sodium chloride    . nitroGLYCERIN Stopped (03/23/18 0949)   PRN Meds: sodium chloride, acetaminophen, acetaminophen, nitroGLYCERIN, ondansetron (ZOFRAN) IV, sodium chloride flush   Vital Signs    Vitals:   03/23/18 1516 03/23/18 2000 03/23/18 2058 03/24/18 0330  BP: 114/63  (!) 99/57 (!) 111/53  Pulse: 66  74 64  Resp: 18 20 (!) 21 16  Temp: 99.2 F (37.3 C)  98.7 F (37.1 C) 97.9 F (36.6 C)  TempSrc: Oral  Oral Oral  SpO2: 97%  94% 95%  Weight:    67 kg  Height:        Intake/Output Summary (Last 24 hours) at 03/24/2018 0759 Last data filed at 03/23/2018 2059 Gross per 24 hour  Intake 360 ml  Output 700 ml  Net -340 ml   Filed Weights   03/22/18 2027 03/24/18 0330  Weight: 66.2 kg 67 kg    Physical Exam   General: Well developed, well nourished, NAD Skin: Warm, dry, intact  Head: Normocephalic, atraumatic, clear, moist mucus membranes. Neck: Negative for carotid bruits. No JVD Lungs:Clear to ausculation bilaterally. No wheezes, rales, or rhonchi. Breathing is unlabored. Cardiovascular: RRR with S1 S2. No murmurs, rubs, gallops, or LV heave appreciated. Abdomen: Soft, non-tender, non-distended with normoactive bowel sounds.  No obvious abdominal masses. MSK: Strength and tone appear normal for age. 5/5 in all  extremities Extremities: No edema. No clubbing or cyanosis. DP/PT pulses 2+ bilaterally Neuro: Alert and oriented. No focal deficits. No facial asymmetry. MAE spontaneously. Psych: Responds to questions appropriately with normal affect.    Labs    Chemistry Recent Labs  Lab 03/22/18 2048 03/23/18 0531 03/24/18 0117  NA 132* 134* 135  K 3.3* 3.2* 3.8  CL 93* 97* 105  CO2 25 23 21*  GLUCOSE 182* 164* 130*  BUN 9 7* 10  CREATININE 0.80 0.81 0.99  CALCIUM 8.8* 8.5* 7.9*  PROT 7.1  --   --   ALBUMIN 3.8  --   --   AST 50*  --   --   ALT 27  --   --   ALKPHOS 53  --   --   BILITOT 0.8  --   --   GFRNONAA >60 >60 >60  GFRAA >60 >60 >60  ANIONGAP 14 14 9      Hematology Recent Labs  Lab 03/23/18 0259 03/23/18 0531 03/24/18 0117  WBC 11.5* 11.7* 9.9  RBC 5.19 5.18 4.45  HGB 15.8 15.6 13.7  HCT 44.8 44.8 39.0  MCV 86.3 86.5 87.6  MCH 30.4 30.1 30.8  MCHC 35.3 34.8 35.1  RDW 12.3 12.3 12.6  PLT 283 286 247    Cardiac Enzymes Recent Labs  Lab 03/23/18 0531 03/23/18 1457 03/23/18 1931 03/24/18 0117  TROPONINI  4.94* 30.32* 15.50* 11.01*    Recent Labs  Lab 03/22/18 2055 03/23/18 0001  TROPIPOC 0.91* 1.55*     BNPNo results for input(s): BNP, PROBNP in the last 168 hours.   DDimer No results for input(s): DDIMER in the last 168 hours.   Radiology    Dg Chest 2 View  Result Date: 03/23/2018 CLINICAL DATA:  Central chest pain for 2 days EXAM: CHEST - 2 VIEW COMPARISON:  None. FINDINGS: The heart size and mediastinal contours are within normal limits. Both lungs are clear. The visualized skeletal structures are unremarkable. IMPRESSION: No active cardiopulmonary disease. Electronically Signed   By: Inez Catalina M.D.   On: 03/23/2018 08:27   Dg Chest Portable 1 View  Result Date: 03/22/2018 CLINICAL DATA:  Chest pain. EXAM: PORTABLE CHEST 1 VIEW COMPARISON:  Chest x-ray from same day at 15:37. FINDINGS: The heart size and mediastinal contours are within normal  limits. Normal pulmonary vascularity. No focal consolidation, pleural effusion, or pneumothorax. No acute osseous abnormality. IMPRESSION: No active disease. Electronically Signed   By: Titus Dubin M.D.   On: 03/22/2018 21:28   Telemetry    03/24/2018 NSR - Personally Reviewed  ECG    No new tracing as of 03/24/2018 - Personally Reviewed  Cardiac Studies   Cardiac catheterization 03/23/2018:   Mid RCA lesion is 25% stenosed.  Prox RCA lesion is 25% stenosed.  Dist RCA lesion is 25% stenosed.  Post Atrio lesion is 50% stenosed.  Mid Cx lesion is 100% stenosed. This was the culprit lesion.  A drug-eluting stent was successfully placed using a STENT SYNERGY DES 3X24.  Post intervention, there is a 0% residual stenosis.  Mid LAD lesion is 25% stenosed.  Ost 1st Diag to 1st Diag lesion is 25% stenosed.  The left ventricular systolic function is normal.  LV end diastolic pressure is normal.  The left ventricular ejection fraction is 55-65% by visual estimate.  There is no aortic valve stenosis.  EBU 3.75 Guide would have been preferable.  Continue aggressive secondary prevention. He will be watched overnight.  _____________ Echocardiogram 03/23/2018:  FINDINGS Left Ventricle: The left ventricle appears to be normal in size, has mild wall thickness normal systolic function with a 25-42% ejection fraction Spectral Doppler shows indeterminate pattern of diastolic filling. There is moderate hypokinesis of the  basiliar-mid anterolateral left ventricular segment. Septal left ventricular hypertrophy. Right Ventricle: The right ventricle is normal in size mildly increased wall thickness has normal systolic function. Left Atrium: The left atrium is normal in size. Right Atrium: The right atrial size is normal in size. Interatrial Septum: No atrial level shunt detected by color flow Doppler.  Patient Profile     73 y.o. male  former smoker with HTN, prior CVA on  plavix who presents with a few days of epigastric discomfort found to have elevated biomarkers concerning for NSTEMI.  Assessment & Plan    1. NSTEMI: -Peak troponin, 30.32>15.50>11.01 -Denies recurrent chest pain  -Cath showed a 100% stenosed mid LCx lesion in which a successful PTCA/DES was placed with 0% residual.  LVEF was noted to be normal, estimated at 55-65 by visual estimate -Echocardiogram confirmed LVEF of 60 to 65% with moderate hypokinesis of the basiliar-mid anterolateral left ventricular segment and   septal left ventricular hypertrophy. -Continue ASA, Plavix, BB and statin   2. DM2: -Hemoglobin A1c, 6.8 -We will resume metformin 03/26/2018 -We will need to follow with PCP for further medication adjustments  3. HTN: -Stable, 111/53,  99/57, 114/63 -Continue metoprolol 25 mg   Signed, Kathyrn Drown NP-C [facility] Pager: [phone] 03/24/2018, 7:59 AM    ATTENDING ATTESTATION  I have seen, examined and evaluated the patient this AM along with Kathyrn Drown, NP-C.  After reviewing all the available data and chart, we discussed the patients laboratory, study & physical findings as well as symptoms in detail. I agree with her findings, examination as well as impression recommendations as per our discussion.    I saw and examined the patient using the video interpreter, and then returned to see him with his wife present. She is a retired Marine scientist which makes it somewhat helpful.  However, conversations are somewhat convoluted and therefore complicated. I did discuss with her the questionable stroke history, it sounds like he may have had some small strokes, but no bleeding complications.  I therefore think is probably reasonable to convert to Brilinta -will give loading dose tonight and then start 90 mg twice daily.  She asked about him having an MI on Plavix and questioned its efficacy.  That makes sense.  Otherwise on stable regimen.  Blood pressure is borderline and  cannot titrate medications further. Needs to work with cardiac rehab.  Although non-STEMI by EKG, but pathophysiology this is more consistent with STEMI physiology having a totally occluded artery.  Therefore had troponin elevations as high as 30 with rapid washout.  As such, would prefer to watch him at least 1 more night to ensure that there are no peri-infarct complications.  We can also then follow his blood pressures on current regimen closely.  Otherwise, if stable in the morning, would anticipate discharge home to follow-up in our [place] office with Dr. Bettina Gavia.    Glenetta Hew, M.D., M.S. Interventional Cardiologist   Pager # [phone] Phone # [phone] [address] [place], [place] [address]       For questions or updates, please contact   Please consult [email or web address] for contact info under Cardiology/STEMI.

## 2018-03-24 NOTE — Discharge Summary (Signed)
Discharge Summary    Patient ID: Bryan Henderson MRN: [id number]; DOB: 01/14/1946  Admit date: 03/22/2018 Discharge date: 03/25/2018  Primary Care Provider: Shelda Pal, DO  Primary Cardiologist: Shirlee More, MD   Discharge Diagnoses    Principal Problem:   NSTEMI (non-ST elevated myocardial infarction) [facility]) Active Problems:   Diabetes mellitus type 2 in nonobese ([place])   HTN (hypertension)  Allergies No Known Allergies  Diagnostic Studies/Procedures    Cardiac catheterization 03/23/2018:   Mid RCA lesion is 25% stenosed.  Prox RCA lesion is 25% stenosed.  Dist RCA lesion is 25% stenosed.  Post Atrio lesion is 50% stenosed.  Mid Cx lesion is 100% stenosed. This was the culprit lesion.  A drug-eluting stent was successfully placed using a STENT SYNERGY DES 3X24.  Post intervention, there is a 0% residual stenosis.  Mid LAD lesion is 25% stenosed.  Ost 1st Diag to 1st Diag lesion is 25% stenosed.  The left ventricular systolic function is normal.  LV end diastolic pressure is normal.  The left ventricular ejection fraction is 55-65% by visual estimate.  There is no aortic valve stenosis.  EBU 3.75 Guide would have been preferable.   Continue aggressive secondary prevention.  He will be watched overnight.   _____________ Echocardiogram 03/23/2018:  FINDINGS  Left Ventricle: The left ventricle appears to be normal in size, has mild wall thickness normal systolic function with a 20-25% ejection fraction Spectral Doppler shows indeterminate pattern of diastolic filling. There is moderate hypokinesis of the  basiliar-mid anterolateral left ventricular segment. Septal left ventricular hypertrophy. Right Ventricle: The right ventricle is normal in size mildly increased wall thickness has normal systolic function. Left Atrium: The left atrium is normal in size. Right Atrium: The right atrial size is normal in size. Interatrial Septum: No atrial level  shunt detected by color flow Doppler.  History of Present Illness     Bryan Henderson is a 73 y.o. male former smoker with HTN, prior CVA on plavix who presents with a few days of epigastric discomfort found to have elevated biomarkers concerning for NSTEMI.   At Bryan Henderson baseline, he exercises 4-5 days a week walking on the treadmill and using light weights and reports being asymptomatic with this.On presentation, he had noted increased epigastric discomfort over the last few days which seemed to be associated with timing of food / spicy food, was not exertional, and did not radiate. His wife (who also has GERD) recommended that he trial a bland diet and a PPI, but this did not appear to help his symptoms. Sunday evening he ate a particularly spicy soup, and developed epigastric and substernal discomfort that did not abate. He sought medical attention on Monday afternoon and described his chest burning, which prompted an EKG and checking enzymes. His initial EKG was without Q waves or evidence of ongoing ischemia, but his outpatient troponin was elevated to 1.0. He was contacted and activated EMS to bring him to the ER.   Upon arrival, he was still complaining of 1-2/10 chest discomfort. Repeat troponin 1.0 -> 1.5. Cardiology was consulted given abnormal cardiac enzymes.   Of note, patient had an extensive work up for GERD including endoscopy with H. Pylori testing last year that was reportedly unremarkable.   Hospital Course   Given his ongoing symptoms and elevated troponin levels, cardiac catheterization was performed which showed a 100% stenosed mid LCx lesion in which a successful PTCA/DES was placed with 0% residual.  LVEF was noted to be normal,  estimated at 55-65 by visual estimate.  Follow-up echocardiogram confirmed LVEF of 60 to 65% with moderate hypokinesis of the basiliar-mid anterolateral left ventricular segment and   septal left ventricular hypertrophy.  He was transferred to  postprocedural unit for overnight observation.  On day of discharge, vital signs are stable.  Cath site unremarkable.  He is walked with cardiac rehabilitation without complication.  Troponins have been trending down, 30.32, 15.50, 11.01.  Creatinine today, 0.99.  Hemoglobin stable at 13.7.  Denies recurrent chest pain.  Patient was loaded with Brilinta 180 mg on 03/24/2018 and started on maintenance dose of 90 mg twice daily.  Other hospital problems include:  -DM2: -Hemoglobin A1c, 6.8 -We will resume metformin 03/26/2018 -We will need to follow with PCP for further medication adjustments  -HTN: -Stable, 111/53, 99/57, 114/63 -Continue metoprolol 25 mg  General: Well developed, well nourished, NAD Skin: Warm, dry, intact  Head: Normocephalic, atraumatic, clear, moist mucus membranes. Neck: Negative for carotid bruits. No JVD Lungs:Clear to ausculation bilaterally. No wheezes, rales, or rhonchi. Breathing is unlabored. Cardiovascular: RRR with S1 S2. No murmurs, rubs, gallops, or LV heave appreciated. Abdomen: Soft, non-tender, non-distended with normoactive bowel sounds. No obvious abdominal masses. MSK: Strength and tone appear normal for age. 5/5 in all extremities Extremities: No edema. No clubbing or cyanosis. DP/PT pulses 2+ bilaterally Neuro: Alert and oriented. No focal deficits. No facial asymmetry. MAE spontaneously. Psych: Responds to questions appropriately with normal affect.    Consultants: None  The patient has been seen and examined by Dr. Ellyn Hack who feels that he is stable and ready for discharge today, 03/24/2018.  Follow-up appointment has been made. _____________  Discharge Vitals Blood pressure (!) 98/45, pulse 63, temperature 98.4 F (36.9 C), temperature source Oral, resp. rate (!) 22, height 5\' 7"  (1.702 m), weight 67.8 kg, SpO2 98 %.  Filed Weights   03/22/18 2027 03/24/18 0330 03/25/18 0623  Weight: 66.2 kg 67 kg 67.8 kg   Labs & Radiologic Studies      CBC Recent Labs    03/22/18 1524  03/23/18 0531 03/24/18 0117  WBC 6.7   < > 11.7* 9.9  NEUTROABS 4,851  --   --   --   HGB 17.0   < > 15.6 13.7  HCT 49.1   < > 44.8 39.0  MCV 90.3   < > 86.5 87.6  PLT 287   < > 286 247   < > = values in this interval not displayed.   Basic Metabolic Panel Recent Labs    03/22/18 2048 03/23/18 0531 03/24/18 0117  NA 132* 134* 135  K 3.3* 3.2* 3.8  CL 93* 97* 105  CO2 25 23 21*  GLUCOSE 182* 164* 130*  BUN 9 7* 10  CREATININE 0.80 0.81 0.99  CALCIUM 8.8* 8.5* 7.9*  MG 1.6*  --   --    Liver Function Tests Recent Labs    03/22/18 2048  AST 50*  ALT 27  ALKPHOS 53  BILITOT 0.8  PROT 7.1  ALBUMIN 3.8   Cardiac Enzymes Recent Labs    03/23/18 1457 03/23/18 1931 03/24/18 0117  TROPONINI 30.32* 15.50* 11.01*   Hemoglobin A1C Recent Labs    03/23/18 0259  HGBA1C 6.8*   Fasting Lipid Panel Recent Labs    03/23/18 0530  CHOL 128  HDL 32*  LDLCALC 81  TRIG 76  CHOLHDL 4.0  _____________  Dg Chest 2 View  Result Date: 03/23/2018 CLINICAL DATA:  Central chest  pain for 2 days EXAM: CHEST - 2 VIEW COMPARISON:  None. FINDINGS: The heart size and mediastinal contours are within normal limits. Both lungs are clear. The visualized skeletal structures are unremarkable. IMPRESSION: No active cardiopulmonary disease. Electronically Signed   By: Inez Catalina M.D.   On: 03/23/2018 08:27   Dg Chest Portable 1 View  Result Date: 03/22/2018 CLINICAL DATA:  Chest pain. EXAM: PORTABLE CHEST 1 VIEW COMPARISON:  Chest x-ray from same day at 15:37. FINDINGS: The heart size and mediastinal contours are within normal limits. Normal pulmonary vascularity. No focal consolidation, pleural effusion, or pneumothorax. No acute osseous abnormality. IMPRESSION: No active disease. Electronically Signed   By: Titus Dubin M.D.   On: 03/22/2018 21:28   Disposition   Pt is being discharged home today in good condition.  Follow-up Plans &  Appointments    Follow-up Information    Richardo Priest, MD Follow up on 04/06/2018.   Specialty:  Cardiology Why:  Follow-up appointment will be on 04/06/2018 at 2 PM with Dr. Bettina Gavia. Contact information: [place] [address] [phone]          Discharge Instructions    AMB Referral to Cardiac Rehabilitation - Phase II   Complete by:  As directed    Diagnosis:   NSTEMI Coronary Stents     Amb Referral to Cardiac Rehabilitation   Complete by:  As directed    Referring to [place] [facility] 2   Diagnosis:   NSTEMI Coronary Stents     Call MD for:  difficulty breathing, headache or visual disturbances   Complete by:  As directed    Call MD for:  extreme fatigue   Complete by:  As directed    Call MD for:  hives   Complete by:  As directed    Call MD for:  persistant dizziness or light-headedness   Complete by:  As directed    Call MD for:  persistant nausea and vomiting   Complete by:  As directed    Call MD for:  redness, tenderness, or signs of infection (pain, swelling, redness, odor or green/yellow discharge around incision site)   Complete by:  As directed    Call MD for:  severe uncontrolled pain   Complete by:  As directed    Call MD for:  temperature >100.4   Complete by:  As directed    Diet - low sodium heart healthy   Complete by:  As directed    Discharge instructions   Complete by:  As directed    No driving for 3 days. No lifting over 5 lbs for 1 week. No sexual activity for 1 week. Keep procedure site clean & dry. If you notice increased pain, swelling, bleeding or pus, call/return!  You may shower, but no soaking baths/hot tubs/pools for 1 week.   PLEASE DO NOT MISS ANY DOSES OF YOUR BRILINTA!!!!! Also keep a log of you blood pressures and bring back to your follow up appt. Please call the office with any questions.   Patients taking blood thinners should generally stay away from medicines like ibuprofen, Advil, Motrin,  naproxen, and Aleve due to risk of stomach bleeding. You may take Tylenol as directed or talk to your primary doctor about alternatives.  Some studies suggest Prilosec/Omeprazole interacts with Plavix. If you have reflux symptoms, please use Protonix for less chance of interaction.   If you notice any bleeding such as blood in stool,  black tarry stools, blood in urine, nosebleeds or any other unusual bleeding, call your doctor immediately. It is not normal to have this kind of bleeding while on a blood thinner and usually indicates there is an underlying problem with one of your body systems that needs to be checked out.   Increase activity slowly   Complete by:  As directed       Discharge Medications   Allergies as of 03/25/2018   No Known Allergies     Medication List    STOP taking these medications   amLODipine 10 MG tablet Commonly known as:  NORVASC   clopidogrel 75 MG tablet Commonly known as:  PLAVIX   hydrochlorothiazide 12.5 MG capsule Commonly known as:  MICROZIDE     TAKE these medications   aspirin 81 MG EC tablet Take 1 tablet (81 mg total) by mouth daily. Start taking on:  March 26, 2018   atorvastatin 80 MG tablet Commonly known as:  LIPITOR Take 1 tablet (80 mg total) by mouth daily at 6 PM. What changed:    medication strength  how much to take  when to take this   b complex vitamins capsule Take 1 capsule by mouth daily.   Fish Oil 1000 MG Caps Take 1 capsule by mouth daily.   metFORMIN 500 MG tablet Commonly known as:  GLUCOPHAGE Take 1 tablet (500 mg total) by mouth 2 (two) times daily with a meal.   metoprolol tartrate 25 MG tablet Commonly known as:  LOPRESSOR Take 1 tablet (25 mg total) by mouth 2 (two) times daily.   mirtazapine 30 MG tablet Commonly known as:  REMERON Take 1 tablet (30 mg total) by mouth at bedtime.   multivitamin tablet Take 1 tablet by mouth daily.   nitroGLYCERIN 0.4 MG SL tablet Commonly known as:   NITROSTAT Place 1 tablet (0.4 mg total) under the tongue every 5 (five) minutes as needed for chest pain.   pantoprazole 40 MG tablet Commonly known as:  PROTONIX Take 1 tablet (40 mg total) by mouth daily.   ticagrelor 90 MG Tabs tablet Commonly known as:  BRILINTA Take 1 tablet (90 mg total) by mouth 2 (two) times daily.   vitamin C 500 MG tablet Commonly known as:  ASCORBIC ACID Take 500 mg by mouth daily.        Acute coronary syndrome (MI, NSTEMI, STEMI, etc) this admission?: Yes.     AHA/ACC Clinical Performance & Quality Measures: 1. Aspirin prescribed? - Yes 2. ADP Receptor Inhibitor (Plavix/Clopidogrel, Brilinta/Ticagrelor or Effient/Prasugrel) prescribed (includes medically managed patients)? - Yes 3. Beta Blocker prescribed? - Yes 4. High Intensity Statin (Lipitor 40-80mg  or Crestor 20-40mg ) prescribed? - Yes 5. EF assessed during THIS hospitalization? - Yes 6. For EF <40%, was ACEI/ARB prescribed? - Not Applicable (EF >/= 61%) 7. For EF <40%, Aldosterone Antagonist (Spironolactone or Eplerenone) prescribed? - Not Applicable (EF >/= 44%) 8. Cardiac Rehab Phase II ordered (Included Medically managed Patients)? - Yes     Outstanding Labs/Studies   CBC  Duration of Discharge Encounter   Greater than 30 minutes including physician time.  Signed, Kathyrn Drown, NP 03/25/2018, 12:07 PM

## 2018-03-25 DIAGNOSIS — Z955 Presence of coronary angioplasty implant and graft: Secondary | ICD-10-CM

## 2018-03-25 DIAGNOSIS — E119 Type 2 diabetes mellitus without complications: Secondary | ICD-10-CM

## 2018-03-25 DIAGNOSIS — I10 Essential (primary) hypertension: Secondary | ICD-10-CM

## 2018-03-25 LAB — GLUCOSE, CAPILLARY
Glucose-Capillary: 135 mg/dL — ABNORMAL HIGH (ref 70–99)
Glucose-Capillary: 150 mg/dL — ABNORMAL HIGH (ref 70–99)

## 2018-03-25 MED ORDER — NITROGLYCERIN 0.4 MG SL SUBL: 0.4000 mg | SUBLINGUAL_TABLET | SUBLINGUAL | 0 refills | Status: AC | PRN

## 2018-03-25 MED ORDER — TICAGRELOR 90 MG PO TABS: 90.0000 mg | ORAL_TABLET | Freq: Two times a day (BID) | ORAL | 4 refills | Status: DC

## 2018-03-25 MED ORDER — ATORVASTATIN CALCIUM 80 MG PO TABS: 80.0000 mg | ORAL_TABLET | Freq: Every day | ORAL | 4 refills | Status: DC

## 2018-03-25 MED ORDER — ASPIRIN 81 MG PO TBEC: 81.0000 mg | DELAYED_RELEASE_TABLET | Freq: Every day | ORAL | Status: DC

## 2018-03-25 MED FILL — NITROGLYCERIN 0.4 MG TAB SL: 0.4 | 8 days supply | Qty: 25 | Fill #0

## 2018-03-25 MED FILL — ATORVASTATIN CALCIUM 80 MG: 80 | 90 days supply | Qty: 90 | Fill #0

## 2018-03-25 MED FILL — BRILINTA 90 MG TABLET: 90 | 30 days supply | Qty: 60 | Fill #0

## 2018-03-25 NOTE — Care Management Note (Signed)
Case Management Note  Patient Details  Name: Bryan Henderson MRN: [id number] Date of Birth: [birth year]  Subjective/Objective:    From home with wife, s/p stent intervention, will be on brilinta, NCM informed wife of the co pay amt and that the cardiologist has samples in the office.                  Action/Plan: DC home when ready.   Expected Discharge Date:  03/25/18               Expected Discharge Plan:  Home/Self Care  In-House Referral:     Discharge planning Services  CM Consult, Medication Assistance  Post Acute Care Choice:    Choice offered to:     DME Arranged:    DME Agency:     HH Arranged:    HH Agency:     Status of Service:  Completed, signed off  If discussed at [employer] of Stay Meetings, dates discussed:    Additional Comments:  Zenon Mayo, RN 03/25/2018, 12:52 PM

## 2018-03-25 NOTE — Progress Notes (Signed)
CARDIAC REHAB PHASE I   PRE:  Rate/Rhythm: 66 SR  BP:  Supine: 101/57  Sitting:   Standing:    SaO2:   MODE:  Ambulation: 650 ft   POST:  Rate/Rhythm: 73 SR  BP:  Supine: 108/59  Sitting:   Standing:    SaO2: 92%RA 0800-0820 Pt walked 650 ft on RA with hand held asst. Gait steady and tolerated well. Denied CP.   Graylon Good, RN BSN  03/25/2018 8:17 AM

## 2018-03-25 NOTE — Care Management (Signed)
#  1.   S/W  NAT[employer]EKA  @ [employer] # [phone]   BRILINTA  90 MG BID  COVER- YES C0-PAY- $ 47.00   Q/ L TWO PILL PER DAYS TIER - 3 DRUG  PRIOR APPROVAL- NO  DEDUCTIBLE : NOT MET  PREFERRED PHARMACY : YES [facility]   AND [facility]

## 2018-03-25 NOTE — Consult Note (Signed)
[facility] Primary Care Navigator  03/25/2018  Bryan Henderson 1945-06-12 [id number]   Met withpatientand wife Bonnita Nasuti- RN from [place]) at the bedside toidentify possible discharge needs.  Wife reports that patient was having "chest/ epigastric discomfort", was found to have elevated biomarkers concerning for NSTEMI- non ST elevated myocardial infarction which had led to this admission.   (NSTEMI- non-ST elevated myocardial infarction, diabetes mellitus II in non-obese, HTN)   PatientendorsesDr. Riki Sheer with [facility] as theprimary care provider.   [facility].  Wife reports that she has been managinghis medicationsat home with use of "pillbox"system filled every 2 weeks.  Patient's wife will be driving andprovidingtransportation to his doctors' appointments after discharge.  Patientlives with wife (retired Therapist, sports) at home who will serve as his primary caregiver once discharge.  Anticipated plan for discharge ishome with cardiac rehabilitation in few weeks per wife.  Patient/ wife voiced understandingto callprimary care provider's office oncepatient returnshome,for a post discharge follow-up visitwithin1- 2 weeksor sooner if needs arise.Patient letter (with PCP's contact number) was provided asareminder.  Explained topatient and wife regarding[facility] CM services available for health management andresourcesat home, but denied any needs or concernsfor now. She indicated not feeling the need forservicesat this time and states being capable and well-informed on ways to manage husband'shealth needs and conditionssince she is a retired Therapist, sports.   Patient's wife expressed understanding of need to seekreferral to [facility] care management from primary care providerifdeemed necessary and appropriate  foranyservicesin the nearfuture.   [facility] care management information provided for future needs thatpatientmay have.  Primary care provider's office is listed as providing transition of care (TOC) follow-up.  Patient/ wifehowever,verbally agreedand optedforEMMIcalls to follow-up withhis recovery at home.   Referral made for [facility] calls after discharge.    For additional questions please contact:  Edwena Felty A. Sabastien Tyler, BSN, RN-BC [facility] PRIMARY CARE Navigator Cell: [phone]

## 2018-03-25 NOTE — Care Management Important Message (Signed)
Important Message  Patient Details  Name: Bryan Henderson MRN: [id number] Date of Birth: 1946/01/23   Medicare Important Message Given:  Yes    [name] 03/25/2018, 4:06 PM

## 2018-03-25 NOTE — Care Management Important Message (Signed)
Important Message  Patient Details  Name: Bryan Henderson MRN: [id number] Date of Birth: 1946/02/18   Medicare Important Message Given:  Yes    [name] 03/25/2018, 4:04 PM

## 2018-03-29 ENCOUNTER — Ambulatory Visit (INDEPENDENT_AMBULATORY_CARE_PROVIDER_SITE_OTHER): Payer: Medicare Other | Admitting: Family Medicine

## 2018-03-29 ENCOUNTER — Encounter: Payer: Self-pay | Admitting: Family Medicine

## 2018-03-29 VITALS — BP 120/76 | HR 63 | Temp 98.2°F | Ht 66.0 in | Wt 147.1 lb

## 2018-03-29 DIAGNOSIS — I2510 Atherosclerotic heart disease of native coronary artery without angina pectoris: Secondary | ICD-10-CM | POA: Diagnosis not present

## 2018-03-29 DIAGNOSIS — I25119 Atherosclerotic heart disease of native coronary artery with unspecified angina pectoris: Secondary | ICD-10-CM | POA: Insufficient documentation

## 2018-03-29 NOTE — Progress Notes (Signed)
Pre visit review using our clinic review tool, if applicable. No additional management support is needed unless otherwise documented below in the visit note. 

## 2018-03-29 NOTE — Progress Notes (Signed)
Chief Complaint  Patient presents with  . Hospitalization Follow-up    HPI Bryan Henderson is a 73 y.o. y.o. male who presents for a transition of care visit.  Pt was discharged from [facility] on 03/24/2018.  He is here with his wife who helps interpret.  On 03/22/2018, the patient had a NSTEMI, stent placed in LCX. Started on dual-antiplatelets.  He was on Plavix and 40 mg of Lipitor prior.  The Lipitor is now 80 mg daily.  He is also on a daily aspirin.  He is tolerating these medicines well.  He has an appointment with the cardiology team in 10 days.  They are wondering about certain medications.  Brilinta is quite expensive.    Past Medical History:  Diagnosis Date  . Coronary artery disease   . Depression   . Diabetes mellitus without complication ([place])   . Hypertension   . Stroke [facility])    Allergies as of 03/29/2018   No Known Allergies     Medication List       Accurate as of March 29, 2018  2:51 PM. Always use your most recent med list.        aspirin 81 MG EC tablet Take 1 tablet (81 mg total) by mouth daily.   atorvastatin 80 MG tablet Commonly known as:  LIPITOR Take 1 tablet (80 mg total) by mouth daily at 6 PM.   b complex vitamins capsule Take 1 capsule by mouth daily.   Fish Oil 1000 MG Caps Take 1 capsule by mouth daily.   metFORMIN 500 MG tablet Commonly known as:  GLUCOPHAGE Take 1 tablet (500 mg total) by mouth 2 (two) times daily with a meal.   metoprolol tartrate 25 MG tablet Commonly known as:  LOPRESSOR Take 1 tablet (25 mg total) by mouth 2 (two) times daily.   mirtazapine 30 MG tablet Commonly known as:  REMERON Take 1 tablet (30 mg total) by mouth at bedtime.   multivitamin tablet Take 1 tablet by mouth daily.   nitroGLYCERIN 0.4 MG SL tablet Commonly known as:  NITROSTAT Place 1 tablet (0.4 mg total) under the tongue every 5 (five) minutes as needed for chest pain.   pantoprazole 40 MG tablet Commonly known as:  PROTONIX Take 1 tablet  (40 mg total) by mouth daily.   ticagrelor 90 MG Tabs tablet Commonly known as:  BRILINTA Take 1 tablet (90 mg total) by mouth 2 (two) times daily.   vitamin C 500 MG tablet Commonly known as:  ASCORBIC ACID Take 500 mg by mouth daily.       ROS:  Heart: No chest pain Lungs: No SOB  Objective BP 120/76 (BP Location: Left Arm, Patient Position: Sitting, Cuff Size: Normal)   Pulse 63   Temp 98.2 F (36.8 C) (Oral)   Ht 5\' 6"  (1.676 m)   Wt 147 lb 2 oz (66.7 kg)   SpO2 93%   BMI 23.75 kg/m  General Appearance:  awake, alert, oriented, in no acute distress and well developed, well nourished Head/face:  NCAT Eyes:  EOMI, PERRLA Nose/Sinuses:  negative Mouth/Throat:  Mucosa moist, no lesions; pharynx without erythema, edema or exudate. Neck:  neck- supple, no mass, non-tender and no jvd Lungs: Clear to auscultation.  No rales, rhonchi, or wheezing. Normal effort, no accessory muscle use. Heart:  Heart sounds are normal.  Regular rate and rhythm without murmur, gallop or rub. No bruits. Psych exam: Nml mood and affect, age appropriate judgment and insight  Coronary artery disease involving native heart without angina pectoris, unspecified vessel or lesion type  F/u w cards. Cont dual antiplatelets.  Stay on current dose of Lipitor.  I am okay with his blood pressure today, okay to stay off of amlodipine. He did have some throat clearing issues.  Stay on Protonix until I see him next month.  Could also consider allergy treatment.  He does have a history of seasonal allergies. F/u as originally scheduled in 1 month. The patient and his wife voiced understanding and agreement to the plan.  Greater than 25 minutes were spent face to face with the patient with greater than 50% of this time spent counseling on medications, blood pressure, diet, activity, follow-up.    [place], DO 03/29/18 2:51 PM

## 2018-03-29 NOTE — Patient Instructions (Addendum)
Ease yourself back into physical activity and listen to your body.  Ask Dr. Bettina Gavia about the Brilinta costs.  Keep the diet clean and stay active.  Take your medicine every day. OK to stay off of the Norvasc.   Let us know if you need anything.

## 2018-03-31 ENCOUNTER — Ambulatory Visit: Payer: Medicare Other | Admitting: Family Medicine

## 2018-04-05 DIAGNOSIS — E785 Hyperlipidemia, unspecified: Secondary | ICD-10-CM | POA: Insufficient documentation

## 2018-04-05 NOTE — Progress Notes (Signed)
Cardiology Office Note:    Date:  04/06/2018   ID:  Bryan Henderson, DOB 1946-01-01, MRN [id number]  PCP:  Shelda Pal, DO  Cardiologist:  Shirlee More, MD    Referring MD: Shelda Pal*    ASSESSMENT:    1. Coronary artery disease involving native coronary artery of native heart without angina pectoris   2. Mixed hyperlipidemia   3. Essential hypertension   4. Diabetes mellitus type 2 in nonobese [facility])    PLAN:    In order of problems listed above:  1. Stable CAD after recent PCI continue current treatment including dual antiplatelet agent referred to cardiac rehabilitation 2. Hypertension stable no longer on calcium channel blocker home systolics from 076-2 10 continue beta-blocker 3. Stable continue his high intensity statin 4. Stable diabetes managed by his PCP   Next appointment: 3 months   Medication Adjustments/Labs and Tests Ordered: Current medicines are reviewed at length with the patient today.  Concerns regarding medicines are outlined above.  No orders of the defined types were placed in this encounter.  No orders of the defined types were placed in this encounter.   Chief Complaint  Patient presents with  . Follow-up    after PCI and stent    History of Present Illness:    Bryan Henderson is a 73 y.o. male with a hx of CAD nonSTEMI 03/22/18 with PCI and stent of LCF EF 60% with hypokinesia of the basilar and mid anterolateral segments last seen 03/25/18 at discharge. Compliance with diet, lifestyle and medications: Yes  Since discharge he is on shortness of breath breathlessness related to prominent and a poor appetite.  He like to enter cardiac rehabilitation.  No angina edema orthopnea palpitation or syncope.  Is able to access his previous records from [place] he has a history of hypertension and hyper lipidemia.  He was last seen by cardiology in [place] 12/17/2016 Past Medical History:  Diagnosis Date  . Coronary  artery disease   . Depression   . Diabetes mellitus without complication ([place])   . Hypertension   . Stroke [facility])     Past Surgical History:  Procedure Laterality Date  . CORONARY STENT INTERVENTION N/A 03/23/2018   Procedure: CORONARY STENT INTERVENTION;  Surgeon: Jettie Booze, MD;  Location: [place] [facility];  Service: Cardiovascular;  Laterality: N/A;  . LEFT HEART CATH AND CORONARY ANGIOGRAPHY N/A 03/23/2018   Procedure: LEFT HEART CATH AND CORONARY ANGIOGRAPHY;  Surgeon: Jettie Booze, MD;  Location: [place] [facility];  Service: Cardiovascular;  Laterality: N/A;    Current Medications: Current Meds  Medication Sig  . aspirin EC 81 MG EC tablet Take 1 tablet (81 mg total) by mouth daily.  Marland Kitchen atorvastatin (LIPITOR) 80 MG tablet Take 1 tablet (80 mg total) by mouth daily at 6 PM.  . b complex vitamins capsule Take 1 capsule by mouth daily.  . metFORMIN (GLUCOPHAGE) 500 MG tablet Take 1 tablet (500 mg total) by mouth 2 (two) times daily with a meal.  . metoprolol tartrate (LOPRESSOR) 25 MG tablet Take 1 tablet (25 mg total) by mouth 2 (two) times daily.  . mirtazapine (REMERON) 30 MG tablet Take 1 tablet (30 mg total) by mouth at bedtime.  . Multiple Vitamin (MULTIVITAMIN) tablet Take 1 tablet by mouth daily.  . nitroGLYCERIN (NITROSTAT) 0.4 MG SL tablet Place 1 tablet (0.4 mg total) under the tongue every 5 (five) minutes as needed for chest pain.  . Omega-3 Fatty Acids (  FISH OIL) 1000 MG CAPS Take 1 capsule by mouth daily.  . pantoprazole (PROTONIX) 40 MG tablet Take 1 tablet (40 mg total) by mouth daily.  . ticagrelor (BRILINTA) 90 MG TABS tablet Take 1 tablet (90 mg total) by mouth 2 (two) times daily.  . vitamin C (ASCORBIC ACID) 500 MG tablet Take 500 mg by mouth daily.     Allergies:   Patient has no known allergies.   Social History   Socioeconomic History  . Marital status: Married    Spouse name: Not on file  . Number of children: Not on file  . Years  of education: Not on file  . Highest education level: Not on file  Occupational History  . Not on file  Social Needs  . Financial resource strain: Not on file  . Food insecurity:    Worry: Not on file    Inability: Not on file  . Transportation needs:    Medical: Not on file    Non-medical: Not on file  Tobacco Use  . Smoking status: Never Smoker  . Smokeless tobacco: Never Used  Substance and Sexual Activity  . Alcohol use: Not Currently    Frequency: Never  . Drug use: Never  . Sexual activity: Not on file  Lifestyle  . Physical activity:    Days per week: Not on file    Minutes per session: Not on file  . Stress: Not on file  Relationships  . Social connections:    Talks on phone: Not on file    Gets together: Not on file    Attends religious service: Not on file    Active member of club or organization: Not on file    Attends meetings of clubs or organizations: Not on file    Relationship status: Not on file  Other Topics Concern  . Not on file  Social History Narrative  . Not on file     Family History: The patient's family history includes Hypertension in his brother. There is no history of Heart attack or Heart disease. ROS:   Please see the history of present illness.    All other systems reviewed and are negative.  EKGs/Labs/Other Studies Reviewed:    The following studies were reviewed today:  EKG:  EKG ordered today.  The ekg ordered today demonstrates [place] inferior and lateral T wave inversions increased from 03/24/18  Recent Labs: 03/22/2018: ALT 27; Magnesium 1.6 03/24/2018: BUN 10; Creatinine, Ser 0.99; Hemoglobin 13.7; Platelets 247; Potassium 3.8; Sodium 135  Recent Lipid Panel    Component Value Date/Time   CHOL 128 03/23/2018 0530   TRIG 76 03/23/2018 0530   HDL 32 (L) 03/23/2018 0530   CHOLHDL 4.0 03/23/2018 0530   VLDL 15 03/23/2018 0530   LDLCALC 81 03/23/2018 0530    Physical Exam:    VS:  BP 104/60 (BP Location: Right Arm,  Patient Position: Sitting, Cuff Size: Normal)   Pulse 64   Ht 5\' 7"  (1.702 m)   Wt 145 lb (65.8 kg)   SpO2 98%   BMI 22.71 kg/m     Wt Readings from Last 3 Encounters:  04/06/18 145 lb (65.8 kg)  03/29/18 147 lb 2 oz (66.7 kg)  03/25/18 149 lb 7.6 oz (67.8 kg)     GEN:  Well nourished, well developed in no acute distress HEENT: Normal NECK: No JVD; No carotid bruits LYMPHATICS: No lymphadenopathy CARDIAC: RRR, no murmurs, rubs, gallops RESPIRATORY:  Clear to auscultation without rales, wheezing  or rhonchi  ABDOMEN: Soft, non-tender, non-distended MUSCULOSKELETAL:  No edema; No deformity  SKIN: Warm and dry NEUROLOGIC:  Alert and oriented x 3 PSYCHIATRIC:  Normal affect    Signed, Shirlee More, MD  04/06/2018 2:47 PM    [place]

## 2018-04-06 ENCOUNTER — Ambulatory Visit (INDEPENDENT_AMBULATORY_CARE_PROVIDER_SITE_OTHER): Payer: Medicare Other | Admitting: Cardiology

## 2018-04-06 ENCOUNTER — Encounter: Payer: Self-pay | Admitting: Cardiology

## 2018-04-06 VITALS — BP 104/60 | HR 64 | Ht 67.0 in | Wt 145.0 lb

## 2018-04-06 DIAGNOSIS — I2510 Atherosclerotic heart disease of native coronary artery without angina pectoris: Secondary | ICD-10-CM

## 2018-04-06 DIAGNOSIS — E782 Mixed hyperlipidemia: Secondary | ICD-10-CM

## 2018-04-06 DIAGNOSIS — E119 Type 2 diabetes mellitus without complications: Secondary | ICD-10-CM

## 2018-04-06 DIAGNOSIS — I10 Essential (primary) hypertension: Secondary | ICD-10-CM | POA: Diagnosis not present

## 2018-04-06 NOTE — Patient Instructions (Addendum)
Medication Instructions:  Your physician recommends that you continue on your current medications as directed. Please refer to the Current Medication list given to you today.  If you need a refill on your cardiac medications before your next appointment, please call your pharmacy.   Lab work: NONE  Testing/Procedures: An EKG was performed today.  Referral for cardiac rehabilitation has been sent. You will receive a call to schedule this appointment.   Follow-Up: At [facility], you and your health needs are our priority.  As part of our continuing mission to provide you with exceptional heart care, we have created designated Provider Care Teams.  These Care Teams include your primary Cardiologist (physician) and Advanced Practice Providers (APPs -  Physician Assistants and Nurse Practitioners) who all work together to provide you with the care you need, when you need it. You will need a follow up appointment in 3 months.  Please call our office 2 months in advance to schedule this appointment.  You may see Bryan More, MD     Cardiac Rehabilitation What is cardiac rehabilitation? Cardiac rehabilitation is a treatment program that helps improve the health and well-being of people who have heart problems. Cardiac rehabilitation includes exercise training, education, and counseling to help you get stronger and return to an active lifestyle. This program can help you get better faster and reduce any future hospital stays. Why might I need cardiac rehabilitation? Cardiac rehabilitation programs can help when you have or have had:  A heart attack.  Heart failure.  Peripheral artery disease.  Coronary artery disease.  Angina.  Lung or breathing problems. Cardiac rehabilitation programs are also used when you have had:  Coronary artery bypass graft surgery.  Heart valve replacement.  Heart stent placement.  Heart transplant.  Aneurysm repair. What are the benefits of  cardiac rehabilitation? Cardiac rehabilitation can help:  Reduce problems like chest pain and trouble breathing.  Change risk factors that contribute to heart disease, such as: ? Smoking. ? High blood pressure. ? High cholesterol. ? Diabetes. ? Being out of shape or not active. ? Weighing Henderson than 30% higher than your ideal weight. ? Diet.  Improve your mental outlook so you feel: ? Henderson hopeful. ? Better about yourself. ? Henderson confident about taking care of yourself.  Get support from health experts as well as other people with similar problems.  Learn how to manage and understand your medicines.  Teach your family about your condition and how to participate in your recovery. What happens in cardiac rehabilitation? You will be assessed by a cardiac rehabilitation team. They will check your health history and do a physical exam. You may need blood tests, stress tests, and other evaluations to make sure that you are ready to start cardiac rehabilitation. The cardiac rehabilitation team works with you to make a plan based on your health and goals. Your program will be tailored to fit you and your needs and may change as you progress. You may work with a health care team that includes:  Doctors.  Nurses.  Dietitians.  Psychologists.  Exercise specialists.  Physical and occupational therapists. What are the phases of cardiac rehabilitation? A cardiac rehabilitation program is often divided into phases. You advance from one phase to the next. Phase One  This phase starts while you are still in the hospital. You may start by walking in your room and then in the hall. You may start some simple exercises with a therapist. Phase Two  This phase begins  when you go home or to another facility. This phase may last 8-12 weeks. You will travel to a cardiac rehabilitation center or another place where rehabilitation is offered. You will slowly increase your activity level while being  closely watched by a nurse or therapist. Exercises may include a combination of strength or resistance training and "cardio" or aerobic movement on a treadmill or other machines. Your condition will determine how often and how long these sessions last. In phase two, you may learn how to cook healthy meals, control your blood sugar, and manage your medicines. You may need help with scheduling or planning how and when to take your medicines. If you have questions about your medicines, it is very important that you talk to your health care provider. Phase Three This phase continues for the rest of your life. There will be less supervision. You may still participate in cardiac rehabilitation activities or become part of a group in your community. You may benefit from talking about your experience with other people who are facing similar challenges. Get help right away if:  You have severe chest discomfort, especially if the pain is crushing or pressure-like and spreads to your arms, back, neck, or jaw. Do not wait to see if the pain will go away.  You have weakness or numbness in your face, arms, or legs, especially on one side of the body.  Your speech is slurred.  You are confused.  You have a sudden severe headache or loss of vision.  You have shortness of breath.  You are sweating and have nausea.  You feel dizzy or faint.  You are fatigued. These symptoms may represent a serious problem that is an emergency. Do not wait to see if the symptoms will go away. Get medical help right away. Call your local emergency services (911 in the [place] Do not drive yourself to the hospital. This information is not intended to replace advice given to you by your health care provider. Make sure you discuss any questions you have with your health care provider. Document Released: 11/20/2007 Document Revised: 11/03/2017 Document Reviewed: 12/25/2014 Elsevier Interactive Patient Education  2019 [employer].

## 2018-04-07 ENCOUNTER — Ambulatory Visit: Payer: Self-pay | Admitting: *Deleted

## 2018-04-07 NOTE — Telephone Encounter (Signed)
Pt's wife called with increased b/p readings. pt had a cardiac stent placed 2 weeks ago.  And after that some of his b/p medication was discontinued. Today at 3:30, his b/p was 160/79. Gradually increasing.  This morning it was 136/79 with HR of 68. And now it was 162/70 and HR 68, about 10  mins later it is 132/72 HR 60.  Pt had denied having symptoms of chest pain, headache, nausea, blurred vision or weakness. Appointment scheduled per protocol. Advised to call back for any increase in symptoms including elevated b/p and take him to the ED. Wife voiced understanding. Will keep track of b/p readings until OV.   Reason for Disposition . Systolic BP  >= 741 OR Diastolic >= 638  Answer Assessment - Initial Assessment Questions 1. BLOOD PRESSURE: "What is the blood pressure?" "Did you take at least two measurements 5 minutes apart?"     162/70 and HR 68   2. ONSET: "When did you take your blood pressure?"     Just now 3. HOW: "How did you obtain the blood pressure?" (e.g., visiting nurse, automatic home BP monitor)     Automatic home BP monitor  4. HISTORY: "Do you have a history of high blood pressure?"     yes 5. MEDICATIONS: "Are you taking any medications for blood pressure?" "Have you missed any doses recently?"     Yes and not missed any medication 6. OTHER SYMPTOMS: "Do you have any symptoms?" (e.g., headache, chest pain, blurred vision, difficulty breathing, weakness)     no 7. PREGNANCY: "Is there any chance you are pregnant?" "When was your last menstrual period?"     n/a  Protocols used: HIGH BLOOD PRESSURE-A-AH

## 2018-04-08 ENCOUNTER — Ambulatory Visit: Payer: Medicare Other | Admitting: Family Medicine

## 2018-04-08 ENCOUNTER — Telehealth (HOSPITAL_COMMUNITY): Payer: Self-pay | Admitting: *Deleted

## 2018-04-08 NOTE — Telephone Encounter (Signed)
Spoke to pt wife.  Pt unsure of where she would like her husband to attend for cardiac rehab. Pt resides in [facility] and the initial referral was sent to [facility] by inpatient cardiac rehab staff. Pt wanted additional information about [facility].  Called and spoke to [place] who provided class times and first available apppt.  Called pt wife back again and provided this information.  Pt wife indicated that she would like for her husband to participate at [facility].  Will close this referral for [facility]. Cherre Huger, BSN Cardiac and Training and development officer

## 2018-04-09 ENCOUNTER — Ambulatory Visit: Payer: Medicare Other | Admitting: Family Medicine

## 2018-04-09 ENCOUNTER — Ambulatory Visit (INDEPENDENT_AMBULATORY_CARE_PROVIDER_SITE_OTHER): Payer: Medicare Other | Admitting: Family Medicine

## 2018-04-09 ENCOUNTER — Encounter: Payer: Self-pay | Admitting: Family Medicine

## 2018-04-09 VITALS — BP 110/68 | HR 70 | Temp 97.6°F | Ht 67.0 in | Wt 143.0 lb

## 2018-04-09 DIAGNOSIS — R413 Other amnesia: Secondary | ICD-10-CM | POA: Diagnosis not present

## 2018-04-09 DIAGNOSIS — I10 Essential (primary) hypertension: Secondary | ICD-10-CM | POA: Diagnosis not present

## 2018-04-09 MED ORDER — DONEPEZIL HCL 5 MG PO TABS: 5.0000 mg | ORAL_TABLET | Freq: Every day | ORAL | 2 refills | Status: DC

## 2018-04-09 MED ORDER — METOPROLOL SUCCINATE ER 50 MG PO TB24: 50.0000 mg | ORAL_TABLET | Freq: Every day | ORAL | 3 refills | Status: DC

## 2018-04-09 MED FILL — METOPROLOL SUCCINATE ER 50: 50 | 30 days supply | Qty: 30 | Fill #0

## 2018-04-09 MED FILL — DONEPEZIL HCL 5 MG TABLET: 5 | 30 days supply | Qty: 30 | Fill #0

## 2018-04-09 NOTE — Patient Instructions (Addendum)
Keep checking blood pressures.   Stay active and keep diet healthy.  Let us know if you need anything.

## 2018-04-09 NOTE — Progress Notes (Signed)
CC: HTN  Subjective Bryan Henderson is a 73 y.o. male who presents for hypertension follow up. He does monitor home blood pressures. Blood pressures ranging from 130-160's/80-100's on average. He is compliant with medication-metoprolol tartrate 25 mg twice daily. Patient has these side effects of medication: none He is adhering to a healthy diet overall. Current exercise: Little activity  His wife is also very concerned about his memory.  He did have a formal evaluation in 2015, but it was very difficult because his main language is [place] and he does not speak much [place].  She would like him to start on a medication for dementia.  She states that his memory issues are mild overall and do not affect his daily living excessively.  The patient is frustrated with this as well.  He has never been on any medication for this in the past.   Past Medical History:  Diagnosis Date  . Coronary artery disease   . Depression   . Diabetes mellitus without complication ([place])   . Hypertension   . Stroke [facility])     Review of Systems Cardiovascular: no chest pain Respiratory:  no shortness of breath  Exam BP 110/68 (BP Location: Right Arm, Patient Position: Sitting, Cuff Size: Normal)   Pulse 70   Temp 97.6 F (36.4 C) (Oral)   Ht 5\' 7"  (1.702 m)   Wt 143 lb (64.9 kg)   SpO2 96%   BMI 22.40 kg/m  General:  well developed, well nourished, in no apparent distress Heart: RRR, no bruits, no LE edema Lungs: clear to auscultation, no accessory muscle use Psych: well oriented with normal range of affect and appropriate judgment/insight  Essential hypertension - Plan: metoprolol succinate (TOPROL-XL) 50 MG 24 hr tablet  Memory difficulty - Plan: donepezil (ARICEPT) 5 MG tablet  Increase dose of metoprolol to 50 mg and change from twice daily formulation to a once daily formulation.  Counseled on diet and exercise. Sound like mild cognitive impairment.  Offered referral for formal evaluation but  they declined.  Start low-dose of Aricept.  We will see how he does in the next month. F/u in 4 weeks. The patient voiced understanding and agreement to the plan.  Glen Campbell, DO 04/09/18  4:16 PM

## 2018-04-14 ENCOUNTER — Telehealth: Payer: Self-pay | Admitting: Family Medicine

## 2018-04-14 DIAGNOSIS — I10 Essential (primary) hypertension: Secondary | ICD-10-CM

## 2018-04-14 NOTE — Telephone Encounter (Signed)
Take 2 tabs daily and we will see him next week. TY.

## 2018-04-14 NOTE — Telephone Encounter (Signed)
Called left message to call back 

## 2018-04-15 ENCOUNTER — Encounter: Payer: Medicare Other | Admitting: Family Medicine

## 2018-04-15 NOTE — Telephone Encounter (Signed)
Informed the wife per PCP instructions take 2 tabs daily. She verbalized understanding.

## 2018-04-15 NOTE — Telephone Encounter (Signed)
Called left message to call back 

## 2018-04-15 NOTE — Telephone Encounter (Signed)
Wife is calling Bryan Henderson back

## 2018-04-19 ENCOUNTER — Other Ambulatory Visit: Payer: Self-pay | Admitting: Family Medicine

## 2018-04-19 MED ORDER — METOPROLOL SUCCINATE ER 100 MG PO TB24: 100.0000 mg | ORAL_TABLET | Freq: Every day | ORAL | 0 refills | Status: DC

## 2018-04-19 MED FILL — METOPROLOL SUCCINATE ER 100: 100 | 90 days supply | Qty: 90 | Fill #0

## 2018-04-20 DIAGNOSIS — I214 Non-ST elevation (NSTEMI) myocardial infarction: Secondary | ICD-10-CM | POA: Diagnosis not present

## 2018-04-21 ENCOUNTER — Encounter: Payer: Self-pay | Admitting: Family Medicine

## 2018-04-21 ENCOUNTER — Ambulatory Visit (INDEPENDENT_AMBULATORY_CARE_PROVIDER_SITE_OTHER): Payer: Medicare Other | Admitting: Family Medicine

## 2018-04-21 VITALS — BP 112/68 | HR 57 | Temp 97.8°F | Ht 67.0 in | Wt 144.1 lb

## 2018-04-21 DIAGNOSIS — I10 Essential (primary) hypertension: Secondary | ICD-10-CM

## 2018-04-21 MED ORDER — AMLODIPINE BESYLATE 5 MG PO TABS: 5.0000 mg | ORAL_TABLET | Freq: Every day | ORAL | 3 refills | Status: DC

## 2018-04-21 MED ORDER — PANTOPRAZOLE SODIUM 40 MG PO TBEC: 40.0000 mg | DELAYED_RELEASE_TABLET | Freq: Every day | ORAL | 0 refills | Status: DC

## 2018-04-21 MED FILL — PANTOPRAZOLE SOD DR 40 MG T: 40 | 90 days supply | Qty: 90 | Fill #0

## 2018-04-21 NOTE — Progress Notes (Signed)
Chief Complaint  Patient presents with  . Follow-up    Subjective Bryan Henderson is a 73 y.o. male who presents for hypertension follow up. Here w wife who helps interpret.  He does monitor home blood pressures. Blood pressures ranging from 160's/80's on average. He is compliant with medications- metoprolol succ 100 mg/d. Patient has these side effects of medication: none He is adhering to a healthy diet overall. Current exercise: cardiac rehab- some walking   Past Medical History:  Diagnosis Date  . Coronary artery disease   . Depression   . Diabetes mellitus without complication ([place])   . Hypertension   . Stroke [facility])     Review of Systems Cardiovascular: no chest pain Respiratory:  no shortness of breath  Exam BP 112/68 (BP Location: Left Arm, Patient Position: Sitting)   Pulse (!) 57   Temp 97.8 F (36.6 C) (Oral)   Ht 5\' 7"  (1.702 m)   Wt 144 lb 2 oz (65.4 kg)   SpO2 95%   BMI 22.57 kg/m  General:  well developed, well nourished, in no apparent distress Heart: RRR, no bruits, no LE edema Lungs: clear to auscultation, no accessory muscle use Psych: well oriented with normal range of affect and appropriate judgment/insight  Essential hypertension  Will call in Norvasc 5 mg/d, I would like them to get a new monitor to check at home. Home monitor significantly higher than what I got. Counseled on diet and exercise. F/u in 2 weeks, send message in 1 week w readings on new monitor. The patient and his spouse voiced understanding and agreement to the plan.  [place], DO 04/21/18  12:03 PM

## 2018-04-21 NOTE — Patient Instructions (Addendum)
Hold the amlodipine until we get more readings. Send me a message in 1 week.  Continue the metoprolol at the current dose.  Keep the diet clean and stay active.  Let us know if you need anything.

## 2018-04-22 ENCOUNTER — Other Ambulatory Visit (INDEPENDENT_AMBULATORY_CARE_PROVIDER_SITE_OTHER): Payer: Medicare Other

## 2018-04-22 DIAGNOSIS — E119 Type 2 diabetes mellitus without complications: Secondary | ICD-10-CM | POA: Diagnosis not present

## 2018-04-22 LAB — LIPID PANEL
Cholesterol: 89 mg/dL (ref 0–200)
HDL: 30 mg/dL — AB (ref >39.00)
LDL Cholesterol: 39 mg/dL (ref 0–99)
NonHDL: 58.53
Total CHOL/HDL Ratio: 3
Triglycerides: 99 mg/dL (ref 0.0–149.0)
VLDL: 19.8 mg/dL (ref 0.0–40.0)

## 2018-04-22 LAB — COMPREHENSIVE METABOLIC PANEL
ALT: 17 U/L (ref 0–53)
AST: 16 U/L (ref 0–37)
Albumin: 3.7 g/dL (ref 3.5–5.2)
Alkaline Phosphatase: 76 U/L (ref 39–117)
BUN: 11 mg/dL (ref 6–23)
CO2: 27 mEq/L (ref 19–32)
Calcium: 8.6 mg/dL (ref 8.4–10.5)
Chloride: 106 mEq/L (ref 96–112)
Creatinine, Ser: 0.95 mg/dL (ref 0.40–1.50)
GFR: 77.38 mL/min (ref >60.00)
Glucose, Bld: 123 mg/dL — ABNORMAL HIGH (ref 70–99)
Potassium: 4 mEq/L (ref 3.5–5.1)
Sodium: 140 mEq/L (ref 135–145)
Total Bilirubin: 0.8 mg/dL (ref 0.2–1.2)
Total Protein: 6.5 g/dL (ref 6.0–8.3)

## 2018-04-22 LAB — CBC
HEMATOCRIT: 42.3 % (ref 39.0–52.0)
Hemoglobin: 14.8 g/dL (ref 13.0–17.0)
MCHC: 34.9 g/dL (ref 30.0–36.0)
MCV: 88.9 fl (ref 78.0–100.0)
Platelets: 298 10*3/uL (ref 150.0–400.0)
RBC: 4.76 Mil/uL (ref 4.22–5.81)
RDW: 14.1 % (ref 11.5–15.5)
WBC: 7.9 10*3/uL (ref 4.0–10.5)

## 2018-04-22 LAB — HEMOGLOBIN A1C: Hgb A1c MFr Bld: 6.6 % — ABNORMAL HIGH (ref 4.6–6.5)

## 2018-04-23 NOTE — Addendum Note (Signed)
Addended by: Caffie Pinto on: 04/23/2018 11:01 AM   Modules accepted: Orders

## 2018-04-26 ENCOUNTER — Other Ambulatory Visit (INDEPENDENT_AMBULATORY_CARE_PROVIDER_SITE_OTHER): Payer: Medicare Other

## 2018-04-26 DIAGNOSIS — I214 Non-ST elevation (NSTEMI) myocardial infarction: Secondary | ICD-10-CM | POA: Diagnosis not present

## 2018-04-26 DIAGNOSIS — E119 Type 2 diabetes mellitus without complications: Secondary | ICD-10-CM | POA: Diagnosis not present

## 2018-04-26 LAB — MICROALBUMIN / CREATININE URINE RATIO
Creatinine,U: 62.7 mg/dL
Microalb Creat Ratio: 1.1 mg/g (ref 0.0–30.0)
Microalb, Ur: <0.7 mg/dL (ref 0.0–1.9)

## 2018-04-26 MED FILL — BRILINTA 90 MG TABLET: 90 | 30 days supply | Qty: 60 | Fill #0

## 2018-04-28 ENCOUNTER — Ambulatory Visit: Payer: Medicare Other | Admitting: Family Medicine

## 2018-04-28 DIAGNOSIS — I214 Non-ST elevation (NSTEMI) myocardial infarction: Secondary | ICD-10-CM | POA: Diagnosis not present

## 2018-04-29 DIAGNOSIS — I214 Non-ST elevation (NSTEMI) myocardial infarction: Secondary | ICD-10-CM | POA: Diagnosis not present

## 2018-05-05 DIAGNOSIS — I214 Non-ST elevation (NSTEMI) myocardial infarction: Secondary | ICD-10-CM | POA: Diagnosis not present

## 2018-05-06 ENCOUNTER — Other Ambulatory Visit: Payer: Self-pay

## 2018-05-06 ENCOUNTER — Encounter: Payer: Self-pay | Admitting: Family Medicine

## 2018-05-06 ENCOUNTER — Ambulatory Visit (INDEPENDENT_AMBULATORY_CARE_PROVIDER_SITE_OTHER): Payer: Medicare Other | Admitting: Family Medicine

## 2018-05-06 VITALS — BP 132/62 | HR 64 | Temp 97.5°F | Ht 67.0 in | Wt 145.1 lb

## 2018-05-06 DIAGNOSIS — I214 Non-ST elevation (NSTEMI) myocardial infarction: Secondary | ICD-10-CM | POA: Diagnosis not present

## 2018-05-06 DIAGNOSIS — R413 Other amnesia: Secondary | ICD-10-CM

## 2018-05-06 DIAGNOSIS — I10 Essential (primary) hypertension: Secondary | ICD-10-CM | POA: Diagnosis not present

## 2018-05-06 MED ORDER — DONEPEZIL HCL 5 MG PO TABS: 5.0000 mg | ORAL_TABLET | Freq: Every day | ORAL | 2 refills | Status: DC

## 2018-05-06 MED ORDER — AMLODIPINE BESYLATE 5 MG PO TABS: 5.0000 mg | ORAL_TABLET | Freq: Every day | ORAL | 1 refills | Status: DC

## 2018-05-06 MED FILL — DONEPEZIL HCL 5 MG TABLET: 5 | 90 days supply | Qty: 90 | Fill #0

## 2018-05-06 MED FILL — AMLODIPINE BESYLATE 5 MG TA: 5 | 90 days supply | Qty: 90 | Fill #0

## 2018-05-06 NOTE — Progress Notes (Signed)
Chief Complaint  Patient presents with  . Follow-up    Subjective Bryan Henderson is a 73 y.o. male who presents for hypertension follow up. Here w wife who helps translate.  He does monitor home blood pressures. Blood pressures ranging from 130-140's/60's on average. He is compliant with medications- amlodipine 5 mg/d was ordered but not taken just yet due to conflicting home readings, also on Toprol XL 100 mg/d.  Patient has these side effects of medication: none He is adhering to a healthy diet overall. Current exercise: some walking, cardiac rehab   Past Medical History:  Diagnosis Date  . Coronary artery disease   . Depression   . Diabetes mellitus without complication ([place])   . Hypertension   . Stroke [facility])     Review of Systems Cardiovascular: no chest pain Respiratory:  no shortness of breath  Exam BP 120/68 (BP Location: Left Arm, Patient Position: Sitting, Cuff Size: Normal)   Pulse 64   Temp (!) 97.5 F (36.4 C) (Oral)   Ht 5\' 7"  (1.702 m)   Wt 145 lb 2 oz (65.8 kg)   SpO2 94%   BMI 22.73 kg/m  General:  well developed, well nourished, in no apparent distress Heart: RRR, no bruits, no LE edema Lungs: clear to auscultation, no accessory muscle use Psych: well oriented with normal range of affect and appropriate judgment/insight  Memory difficulty - Plan: donepezil (ARICEPT) 5 MG tablet  Orders as above. He will now take the amlodipine in the evening.  Counseled on diet and exercise. Cont checking BP. F/u in 3 mo, sooner if BP does not normalize. The patient and his spouse voiced understanding and agreement to the plan.  [place], DO 05/06/18  2:13 PM

## 2018-05-06 NOTE — Patient Instructions (Addendum)
Take the amlodipine at night. Continue with the metoprolol.  Continue checking BP at home.   Keep the diet clean and stay active.  Let us know if you need anything.

## 2018-05-06 NOTE — Addendum Note (Signed)
Addended by: Sharon Seller B on: 05/06/2018 02:37 PM   Modules accepted: Orders

## 2018-05-18 MED FILL — BRILINTA 90 MG TABLET: 90 | 30 days supply | Qty: 60 | Fill #1

## 2018-06-28 ENCOUNTER — Other Ambulatory Visit: Payer: Self-pay | Admitting: Family Medicine

## 2018-06-28 MED FILL — MIRTAZAPINE 30 MG TABLET: 30 | 90 days supply | Qty: 90 | Fill #0

## 2018-06-28 MED FILL — metFORMIN HCL 500 MG TABS: 500 | 60 days supply | Qty: 120 | Fill #0

## 2018-06-28 MED FILL — BRILINTA 90 MG TABLET: 90 | 30 days supply | Qty: 60 | Fill #2

## 2018-06-28 MED FILL — ATORVASTATIN 80 MG TABLET: 80 | 90 days supply | Qty: 90 | Fill #0

## 2018-07-12 NOTE — Progress Notes (Signed)
Virtual Visit via Video Note   This visit type was conducted due to national recommendations for restrictions regarding the COVID-19 Pandemic (e.g. social distancing) in an effort to limit this patient's exposure and mitigate transmission in our community.  Due to his co-morbid illnesses, this patient is at least at moderate risk for complications without adequate follow up.  This format is felt to be most appropriate for this patient at this time.  All issues noted in this document were discussed and addressed.  A limited physical exam was performed with this format.  Please refer to the patient's chart for his consent to telehealth for [facility].   Date:  07/13/2018   ID:  Theresa Dohrman, DOB [birth year], MRN [id number]  Patient Location: Home Provider Location: Office  PCP:  Shelda Pal, DO  Cardiologist:  Shirlee More, MD  Electrophysiologist:  None   Evaluation Performed:  Follow-Up Visit  Chief Complaint:  FU for CAD  History of Present Illness:    Bryan Henderson is a 73 y.o. male with a hx of CAD nonSTEMI 03/22/18 with PCI and stent of LCF EF 60% with hypokinesia of the basilar and mid anterolateral segments hypertension hyperlipidemia last seen 04/06/18   The patient does not have symptoms concerning for COVID-19 infection (fever, chills, cough, or new shortness of breath).   He is doing well at home and has had no angina dyspnea palpitation or syncope.  They question duration of Brilinta because they have a $50 co-pay but they remain on the drug for at least 6 months and likely for the year.  They are starting a walking program goal 20 to 30 minutes a day and I will plan to bring him to my office in September check full labs including liver function renal function potassium lipid profile face-to-face visit in office EKG at that time. Past Medical History:  Diagnosis Date  . Coronary artery disease   . Depression   . Diabetes mellitus without complication ([place])    . Hypertension   . Stroke [facility])    Past Surgical History:  Procedure Laterality Date  . CORONARY STENT INTERVENTION N/A 03/23/2018   Procedure: CORONARY STENT INTERVENTION;  Surgeon: Jettie Booze, MD;  Location: [facility];  Service: Cardiovascular;  Laterality: N/A;  . LEFT HEART CATH AND CORONARY ANGIOGRAPHY N/A 03/23/2018   Procedure: LEFT HEART CATH AND CORONARY ANGIOGRAPHY;  Surgeon: Jettie Booze, MD;  Location: [facility];  Service: Cardiovascular;  Laterality: N/A;     Current Meds  Medication Sig  . amLODipine (NORVASC) 5 MG tablet Take 1 tablet (5 mg total) by mouth daily.  Marland Kitchen aspirin EC 81 MG EC tablet Take 1 tablet (81 mg total) by mouth daily.  Marland Kitchen atorvastatin (LIPITOR) 80 MG tablet Take 1 tablet (80 mg total) by mouth daily at 6 PM.  . b complex vitamins capsule Take 1 capsule by mouth daily.  Marland Kitchen donepezil (ARICEPT) 5 MG tablet Take 1 tablet (5 mg total) by mouth at bedtime.  . metFORMIN (GLUCOPHAGE) 500 MG tablet Take 1 tablet (500 mg total) by mouth 2 (two) times daily with a meal.  . metoprolol succinate (TOPROL-XL) 100 MG 24 hr tablet TAKE 1 TABLET (100 MG TOTAL) BY MOUTH DAILY. TAKE WITH OR IMMEDIATELY FOLLOWING A MEAL.  . mirtazapine (REMERON) 30 MG tablet Take 1 tablet (30 mg total) by mouth at bedtime.  . Multiple Vitamin (MULTIVITAMIN) tablet Take 1 tablet by mouth daily.  . nitroGLYCERIN (NITROSTAT)  0.4 MG SL tablet Place 1 tablet (0.4 mg total) under the tongue every 5 (five) minutes as needed for chest pain.  . Omega-3 Fatty Acids (FISH OIL) 1000 MG CAPS Take 1 capsule by mouth daily.  . pantoprazole (PROTONIX) 40 MG tablet TAKE 1 TABLET (40 MG TOTAL) BY MOUTH DAILY.  . ticagrelor (BRILINTA) 90 MG TABS tablet Take 1 tablet (90 mg total) by mouth 2 (two) times daily.  . vitamin C (ASCORBIC ACID) 500 MG tablet Take 500 mg by mouth daily.     Allergies:   Patient has no known allergies.   Social History   Tobacco Use  . Smoking  status: Former Smoker    Types: Cigarettes  . Smokeless tobacco: Never Used  . Tobacco comment: quit in 1986  Substance Use Topics  . Alcohol use: Not Currently    Frequency: Never  . Drug use: Never     Family Hx: The patient's family history includes Hypertension in his brother. There is no history of Heart attack or Heart disease.  ROS:   Please see the history of present illness.    All other systems reviewed and are negative.   Prior CV studies:   The following studies were reviewed today:    Labs/Other Tests and Data Reviewed:    EKG:  No ECG reviewed.  Recent Labs: 03/22/2018: Magnesium 1.6 04/22/2018: ALT 17; BUN 11; Creatinine, Ser 0.95; Hemoglobin 14.8; Platelets 298.0; Potassium 4.0; Sodium 140   Recent Lipid Panel Lab Results  Component Value Date/Time   CHOL 89 04/22/2018 08:05 AM   TRIG 99.0 04/22/2018 08:05 AM   HDL 30.00 (L) 04/22/2018 08:05 AM   CHOLHDL 3 04/22/2018 08:05 AM   LDLCALC 39 04/22/2018 08:05 AM    Wt Readings from Last 3 Encounters:  07/13/18 141 lb (64 kg)  05/06/18 145 lb 2 oz (65.8 kg)  04/21/18 144 lb 2 oz (65.4 kg)     Objective:    Vital Signs:  BP (!) 144/73 (BP Location: Left Arm, Patient Position: Sitting)   Pulse 60   Ht 5\' 7"  (1.702 m)   Wt 141 lb (64 kg)   BMI 22.08 kg/m    VITAL SIGNS:  reviewed his  ASSESSMENT & PLAN:    1. CAD stable New York Heart Association class I continue current medical therapy with aspirin and Brilinta at least 6 months preferably 12 and then transition back to his chronic dual antiplatelet therapy of aspirin and clopidogrel.  At this time having no anginal discomfort and does not require an ischemia evaluation. 2. Hyperlipidemia stable lipids are ideal continue his high intensity statin check liver function lipid profile next visit 3. Hypertension improved since the addition of calcium channel blocker in the evening and his blood pressures consistently run in the 1 30-1 40 range at home.   Continue current therapy 4. Type 2 diabetes stable continue current treatment  COVID-19 Education: The signs and symptoms of COVID-19 were discussed with the patient and how to seek care for testing (follow up with PCP or arrange E-visit).  The importance of social distancing was discussed today.  Time:   Today, I have spent 27 minutes with the patient with telehealth technology discussing the above problems.     Medication Adjustments/Labs and Tests Ordered: Current medicines are reviewed at length with the patient today.  Concerns regarding medicines are outlined above.   Tests Ordered: No orders of the defined types were placed in this encounter.   Medication Changes:  No orders of the defined types were placed in this encounter.   Disposition:  Follow up in 3 month(s)  Signed, Shirlee More, MD  07/13/2018 10:25 AM    [place]

## 2018-07-13 ENCOUNTER — Telehealth (INDEPENDENT_AMBULATORY_CARE_PROVIDER_SITE_OTHER): Payer: Medicare Other | Admitting: Cardiology

## 2018-07-13 ENCOUNTER — Telehealth: Payer: Self-pay | Admitting: Cardiology

## 2018-07-13 ENCOUNTER — Encounter: Payer: Self-pay | Admitting: Cardiology

## 2018-07-13 ENCOUNTER — Other Ambulatory Visit: Payer: Self-pay

## 2018-07-13 VITALS — BP 144/73 | HR 60 | Ht 67.0 in | Wt 141.0 lb

## 2018-07-13 DIAGNOSIS — I2510 Atherosclerotic heart disease of native coronary artery without angina pectoris: Secondary | ICD-10-CM | POA: Diagnosis not present

## 2018-07-13 DIAGNOSIS — E782 Mixed hyperlipidemia: Secondary | ICD-10-CM

## 2018-07-13 DIAGNOSIS — I10 Essential (primary) hypertension: Secondary | ICD-10-CM

## 2018-07-13 DIAGNOSIS — E119 Type 2 diabetes mellitus without complications: Secondary | ICD-10-CM

## 2018-07-13 MED ORDER — METOPROLOL SUCCINATE ER 100 MG PO TB24: 100.0000 mg | ORAL_TABLET | Freq: Every day | ORAL | 1 refills | Status: DC

## 2018-07-13 MED ORDER — AMLODIPINE BESYLATE 5 MG PO TABS: 5.0000 mg | ORAL_TABLET | Freq: Every day | ORAL | 1 refills | Status: DC

## 2018-07-13 MED ORDER — ATORVASTATIN CALCIUM 80 MG PO TABS: 80.0000 mg | ORAL_TABLET | Freq: Every day | ORAL | 1 refills | Status: DC

## 2018-07-13 MED ORDER — TICAGRELOR 90 MG PO TABS: 90.0000 mg | ORAL_TABLET | Freq: Two times a day (BID) | ORAL | 1 refills | Status: DC

## 2018-07-13 NOTE — Telephone Encounter (Signed)
Virtual Visit Pre-Appointment Phone Call  "(Name), I am calling you today to discuss your upcoming appointment. We are currently trying to limit exposure to the virus that causes COVID-19 by seeing patients at home rather than in the office."  1. "What is the BEST phone number to call the day of the visit?" - include this in appointment notes  2. Do you have or have access to (through a family member/friend) a smartphone with video capability that we can use for your visit?" a. If yes - list this number in appt notes as cell (if different from BEST phone #) and list the appointment type as a VIDEO visit in appointment notes b. If no - list the appointment type as a PHONE visit in appointment notes  3. Confirm consent - "In the setting of the current Covid19 crisis, you are scheduled for a (phone or video) visit with your provider on (date) at (time).  Just as we do with many in-office visits, in order for you to participate in this visit, we must obtain consent.  If you'd like, I can send this to your mychart (if signed up) or email for you to review.  Otherwise, I can obtain your verbal consent now.  All virtual visits are billed to your insurance company just like a normal visit would be.  By agreeing to a virtual visit, we'd like you to understand that the technology does not allow for your provider to perform an examination, and thus may limit your provider's ability to fully assess your condition. If your provider identifies any concerns that need to be evaluated in person, we will make arrangements to do so.  Finally, though the technology is pretty good, we cannot assure that it will always work on either your or our end, and in the setting of a video visit, we may have to convert it to a phone-only visit.  In either situation, we cannot ensure that we have a secure connection.  Are you willing to proceed?" STAFF: Did the patient verbally acknowledge consent to telehealth visit? Document  YES/NO here: Yes  4. Advise patient to be prepared - "Two hours prior to your appointment, go ahead and check your blood pressure, pulse, oxygen saturation, and your weight (if you have the equipment to check those) and write them all down. When your visit starts, your provider will ask you for this information. If you have an Apple Watch or Kardia device, please plan to have heart rate information ready on the day of your appointment. Please have a pen and paper handy nearby the day of the visit as well."  5. Give patient instructions for MyChart download to smartphone OR [email or web address] as below if video visit (depending on what platform provider is using)  6. Inform patient they will receive a phone call 15 minutes prior to their appointment time (may be from unknown caller ID) so they should be prepared to answer    TELEPHONE CALL NOTE  Bryan Henderson has been deemed a candidate for a follow-up tele-health visit to limit community exposure during the Covid-19 pandemic. I spoke with the patient via phone to ensure availability of phone/video source, confirm preferred email & phone number, and discuss instructions and expectations.  I reminded Bryan Henderson to be prepared with any vital sign and/or heart rhythm information that could potentially be obtained via home monitoring, at the time of his visit. I reminded Bryan Henderson to expect a phone call prior to his visit.  Calla Kicks 07/13/2018 9:46 AM   INSTRUCTIONS FOR DOWNLOADING THE MYCHART APP TO SMARTPHONE  - The patient must first make sure to have activated MyChart and know their login information - If Apple, go to [employer] and type in MyChart in the search bar and download the app. If Android, ask patient to go to [employer] and type in [place] in the search bar and download the app. The app is free but as with any other app downloads, their phone may require them to verify saved payment information or Apple/Android  password.  - The patient will need to then log into the app with their MyChart username and password, and select  as their healthcare provider to link the account. When it is time for your visit, go to the MyChart app, find appointments, and click Begin Video Visit. Be sure to Select Allow for your device to access the Microphone and Camera for your visit. You will then be connected, and your provider will be with you shortly.  **If they have any issues connecting, or need assistance please contact MyChart service desk [phone])**  **If using a computer, in order to ensure the best quality for their visit they will need to use either of the following Internet Browsers: [employer], or Google Chrome**  IF USING DOXIMITY or DOXY.ME - The patient will receive a link just prior to their visit by text.     FULL LENGTH CONSENT FOR TELE-HEALTH VISIT   I hereby voluntarily request, consent and authorize [place] and its employed or contracted physicians, physician assistants, nurse practitioners or other licensed health care professionals (the Practitioner), to provide me with telemedicine health care services (the Services") as deemed necessary by the treating Practitioner. I acknowledge and consent to receive the Services by the Practitioner via telemedicine. I understand that the telemedicine visit will involve communicating with the Practitioner through live audiovisual communication technology and the disclosure of certain medical information by electronic transmission. I acknowledge that I have been given the opportunity to request an in-person assessment or other available alternative prior to the telemedicine visit and am voluntarily participating in the telemedicine visit.  I understand that I have the right to withhold or withdraw my consent to the use of telemedicine in the course of my care at any time, without affecting my right to future care or treatment,  and that the Practitioner or I may terminate the telemedicine visit at any time. I understand that I have the right to inspect all information obtained and/or recorded in the course of the telemedicine visit and may receive copies of available information for a reasonable fee.  I understand that some of the potential risks of receiving the Services via telemedicine include:   Delay or interruption in medical evaluation due to technological equipment failure or disruption;  Information transmitted may not be sufficient (e.g. poor resolution of images) to allow for appropriate medical decision making by the Practitioner; and/or   In rare instances, security protocols could fail, causing a breach of personal health information.  Furthermore, I acknowledge that it is my responsibility to provide information about my medical history, conditions and care that is complete and accurate to the best of my ability. I acknowledge that Practitioner's advice, recommendations, and/or decision may be based on factors not within their control, such as incomplete or inaccurate data provided by me or distortions of diagnostic images or specimens that may result from electronic transmissions. I understand that the  practice of medicine is not an Chief Strategy Officer and that Practitioner makes no warranties or guarantees regarding treatment outcomes. I acknowledge that I will receive a copy of this consent concurrently upon execution via email to the email address I last provided but may also request a printed copy by calling the office of [place].    I understand that my insurance will be billed for this visit.   I have read or had this consent read to me.  I understand the contents of this consent, which adequately explains the benefits and risks of the Services being provided via telemedicine.   I have been provided ample opportunity to ask questions regarding this consent and the Services and have had my questions  answered to my satisfaction.  I give my informed consent for the services to be provided through the use of telemedicine in my medical care  By participating in this telemedicine visit I agree to the above.

## 2018-07-13 NOTE — Patient Instructions (Signed)
Medication Instructions:  .imstc  If you need a refill on your cardiac medications before your next appointment, please call your pharmacy.   Lab work: Your physician recommends that you return for lab work in: AT [place] CBC,CMP.LIPID  If you have labs (blood work) drawn today and your tests are completely normal, you will receive your results only by: Marland Kitchen MyChart Message (if you have MyChart) OR . A paper copy in the mail If you have any lab test that is abnormal or we need to change your treatment, we will call you to review the results.  Testing/Procedures: EKG at next appointment  Follow-Up: At [facility], you and your health needs are our priority.  As part of our continuing mission to provide you with exceptional heart care, we have created designated Provider Care Teams.  These Care Teams include your primary Cardiologist (physician) and Advanced Practice Providers (APPs -  Physician Assistants and Nurse Practitioners) who all work together to provide you with the care you need, when you need it. You will need a follow up appointment in 4 months.  Any Other Special Instructions Will Be Listed Below (If Applicable).

## 2018-07-28 MED FILL — DONEPEZIL HCL 5 MG TABLET: 5 | 90 days supply | Qty: 90 | Fill #1

## 2018-07-28 MED FILL — PANTOPRAZOLE SOD DR 40 MG T: 40 | 90 days supply | Qty: 90 | Fill #0

## 2018-07-28 MED FILL — METOPROLOL SUCCINATE ER 100: 100 | 90 days supply | Qty: 90 | Fill #0

## 2018-07-28 MED FILL — BRILINTA 90 MG TABLET: 90 | 90 days supply | Qty: 180 | Fill #3

## 2018-07-28 MED FILL — AMLODIPINE BESYLATE 5 MG TA: 5 | 90 days supply | Qty: 90 | Fill #1

## 2018-08-10 ENCOUNTER — Ambulatory Visit: Payer: Medicare Other | Admitting: Family Medicine

## 2018-08-30 ENCOUNTER — Telehealth: Payer: Self-pay | Admitting: Family Medicine

## 2018-08-30 DIAGNOSIS — E119 Type 2 diabetes mellitus without complications: Secondary | ICD-10-CM

## 2018-08-30 NOTE — Telephone Encounter (Signed)
OK to schedule lab visit prior to appt w me. Ty.

## 2018-08-30 NOTE — Telephone Encounter (Signed)
Copied from [place] [id number]. Topic: Appointment Scheduling - Scheduling Inquiry for Clinic >> Aug 30, 2018 11:11 AM Oneta Rack wrote: Reason for CRM: Patient would like to [facility] 08/10/2018 with Dr. Nani Ravens, spouse would like pre visit lab prior to appointment, please advise. (attempted to reach practice)  CBC,,,lipid....cmp in order as active

## 2018-09-06 ENCOUNTER — Other Ambulatory Visit (INDEPENDENT_AMBULATORY_CARE_PROVIDER_SITE_OTHER): Payer: Medicare Other

## 2018-09-06 ENCOUNTER — Other Ambulatory Visit: Payer: Self-pay

## 2018-09-06 DIAGNOSIS — E119 Type 2 diabetes mellitus without complications: Secondary | ICD-10-CM | POA: Diagnosis not present

## 2018-09-06 LAB — HEMOGLOBIN A1C: Hgb A1c MFr Bld: 6.6 % — ABNORMAL HIGH (ref 4.6–6.5)

## 2018-09-10 ENCOUNTER — Encounter: Payer: Self-pay | Admitting: Family Medicine

## 2018-09-10 ENCOUNTER — Ambulatory Visit (INDEPENDENT_AMBULATORY_CARE_PROVIDER_SITE_OTHER): Payer: Medicare Other | Admitting: Family Medicine

## 2018-09-10 ENCOUNTER — Other Ambulatory Visit: Payer: Self-pay

## 2018-09-10 VITALS — BP 108/60 | HR 60 | Temp 98.0°F | Ht 66.0 in | Wt 142.1 lb

## 2018-09-10 DIAGNOSIS — H269 Unspecified cataract: Secondary | ICD-10-CM

## 2018-09-10 DIAGNOSIS — R413 Other amnesia: Secondary | ICD-10-CM

## 2018-09-10 DIAGNOSIS — Z1159 Encounter for screening for other viral diseases: Secondary | ICD-10-CM | POA: Diagnosis not present

## 2018-09-10 DIAGNOSIS — E119 Type 2 diabetes mellitus without complications: Secondary | ICD-10-CM | POA: Diagnosis not present

## 2018-09-10 DIAGNOSIS — Z23 Encounter for immunization: Secondary | ICD-10-CM

## 2018-09-10 DIAGNOSIS — I10 Essential (primary) hypertension: Secondary | ICD-10-CM | POA: Diagnosis not present

## 2018-09-10 NOTE — Addendum Note (Signed)
Addended by: Sharon Seller B on: 09/10/2018 10:11 AM   Modules accepted: Orders

## 2018-09-10 NOTE — Progress Notes (Signed)
Subjective:   Chief Complaint  Patient presents with  . Follow-up    Bryan Henderson is a 73 y.o. male here for follow-up of diabetes.  Here w his wife who helps interpret.  Bryan Henderson does not check sugars. Patient does not require insulin.   Medications include: Metformin, no AE Diet is healthy. Exercise: walking  Hypertension Patient presents for hypertension follow up. He does not monitor home blood pressures. He is compliant with medications- Norvasc 5 mg/d, Toprol XL 100 mg/d. Patient has these side effects of medication: none He is adhering to a healthy diet overall. Exercise: walking  Memory does not seem to have worsened since last visit. Doing well on Aricept. Hx of stroke, was also suggested he continue this. No AE's. Wife and pt OK with staying on this.   Past Medical History:  Diagnosis Date  . Coronary artery disease   . Depression   . Diabetes mellitus without complication ([place])   . Hypertension   . Stroke [facility])      Related testing: Date of retinal exam: Would like new provider Pneumovax: done Flu Shot: done  Review of Systems: Pulmonary:  No SOB Cardiovascular:  No chest pain  Objective:  BP 108/60 (BP Location: Left Arm, Patient Position: Sitting, Cuff Size: Normal)   Pulse 60   Temp 98 F (36.7 C) (Oral)   Ht 5\' 6"  (1.676 m)   Wt 142 lb 2 oz (64.5 kg)   SpO2 96%   BMI 22.94 kg/m  General:  Well developed, well nourished, in no apparent distress Skin:  Warm, no pallor or diaphoresis Head:  Normocephalic, atraumatic Eyes:  Pupils equal and round, sclera anicteric without injection  Lungs:  CTAB, no access msc use Cardio:  RRR, no bruits, no LE edema Musculoskeletal:  Symmetrical muscle groups noted without atrophy or deformity Neuro:  Sensation intact to pinprick on feet Psych: Age appropriate judgment and insight  Assessment:   Diabetes mellitus type 2 in nonobese ([place]) - Plan: Ambulatory referral to Ophthalmology, [facility] DIABETES FOOT EXAM,  cont metformin, well controlled. OK to hold off on checking sugars.  Essential hypertension - Plan: OK to stop ck'ing BP, cont Toprol and Norvasc  Encounter for hepatitis C screening test for low risk patient - Plan: Hepatitis C antibody  Cataract of both eyes, unspecified cataract type - Plan: Ambulatory referral to Ophthalmology, [place]  Need for Tdap vaccination - Plan: Tdap vaccine greater than or equal to 7yo IM  Memory difficulty - Plan: Cont Aricept   Plan:   Orders as above. Counseled on diet and exercise. F/u in 6 mo. The patient and his wife voiced understanding and agreement to the plan.  [place], DO 09/10/18 10:04 AM

## 2018-09-10 NOTE — Patient Instructions (Addendum)
Keep the diet clean and stay active.  OK to hold off on checking both blood pressure and blood sugars unless there is an issue.   If you do not hear anything about your referral in the next 1-2 weeks, call our office and ask for an update.  No changes in medicine for now.   Let us know if you need anything.

## 2018-09-20 MED FILL — ATORVASTATIN 80 MG TABLET: 80 | 90 days supply | Qty: 90 | Fill #1

## 2018-10-01 ENCOUNTER — Other Ambulatory Visit: Payer: Self-pay | Admitting: Family Medicine

## 2018-10-01 MED ORDER — METFORMIN HCL 500 MG PO TABS: 500.0000 mg | ORAL_TABLET | Freq: Two times a day (BID) | ORAL | 1 refills | Status: DC

## 2018-10-01 MED FILL — metFORMIN HCL 500 MG TABS: 500 | 90 days supply | Qty: 180 | Fill #0

## 2018-10-19 MED FILL — BRILINTA 90 MG TABLET: 90 | 90 days supply | Qty: 180 | Fill #4

## 2018-10-25 MED FILL — METOPROLOL SUCCINATE ER 100: 100 | 90 days supply | Qty: 90 | Fill #0

## 2018-10-25 MED FILL — DONEPEZIL HCL 5 MG TABLET: 5 | 90 days supply | Qty: 90 | Fill #2

## 2018-10-25 MED FILL — AMLODIPINE BESYLATE 5 MG TA: 5 | 90 days supply | Qty: 90 | Fill #0

## 2018-11-15 ENCOUNTER — Other Ambulatory Visit: Payer: Self-pay | Admitting: Family Medicine

## 2018-11-15 MED FILL — PANTOPRAZOLE SOD DR 40 MG T: 40 | 90 days supply | Qty: 90 | Fill #0

## 2018-11-22 ENCOUNTER — Ambulatory Visit: Payer: Medicare Other | Admitting: Cardiology

## 2018-12-16 MED FILL — ATORVASTATIN 80 MG TABLET: 80 | 90 days supply | Qty: 90 | Fill #2

## 2018-12-16 MED FILL — MIRTAZAPINE 30 MG TABLET: 30 | 90 days supply | Qty: 90 | Fill #1

## 2018-12-27 MED FILL — metFORMIN HCL 500 MG TABS: 500 | 90 days supply | Qty: 180 | Fill #1

## 2019-01-05 ENCOUNTER — Ambulatory Visit: Payer: Medicare Other | Admitting: Cardiology

## 2019-01-24 ENCOUNTER — Other Ambulatory Visit: Payer: Self-pay | Admitting: Family Medicine

## 2019-01-24 DIAGNOSIS — R413 Other amnesia: Secondary | ICD-10-CM

## 2019-01-24 MED FILL — DONEPEZIL HCL 5 MG TABLET: 5 | 90 days supply | Qty: 90 | Fill #0

## 2019-01-24 MED FILL — BRILINTA 90 MG TABLET: 90 | 30 days supply | Qty: 60 | Fill #5

## 2019-01-24 MED FILL — PANTOPRAZOLE SOD DR 40 MG T: 40 | 90 days supply | Qty: 90 | Fill #0

## 2019-01-24 MED FILL — AMLODIPINE BESYLATE 5 MG TA: 5 | 90 days supply | Qty: 90 | Fill #1

## 2019-01-24 MED FILL — METOPROLOL SUCCINATE ER 100: 100 | 90 days supply | Qty: 90 | Fill #1

## 2019-02-09 NOTE — Progress Notes (Signed)
Cardiology Office Note:    Date:  02/10/2019   ID:  Bryan Henderson, DOB 06-24-1945, MRN [id number]  PCP:  Shelda Pal, DO  Cardiologist:  Shirlee More, MD    Referring MD: Shelda Pal*    ASSESSMENT:    1. Coronary artery disease involving native coronary artery of native heart without angina pectoris   2. Essential hypertension   3. Mixed hyperlipidemia    PLAN:    In order of problems listed above:  1. Stable CAD approaching 1 year from his PCI and stent he is having financial toxicity and can transition from Brilinta to clopidogrel when he runs out in a few days.  He will continue his high intensity statin calcium channel blocker for hypertension and beta-blocker. 2. Stable continue current treatment 3. Continue statin check CMP lipid profile   Next appointment: 6 months   Medication Adjustments/Labs and Tests Ordered: Current medicines are reviewed at length with the patient today.  Concerns regarding medicines are outlined above.  No orders of the defined types were placed in this encounter.  No orders of the defined types were placed in this encounter.   Chief Complaint  Patient presents with  . Follow-up  . Coronary Artery Disease    History of Present Illness:    Bryan Henderson is a 73 y.o. male with a hx of CAD nonSTEMI 03/22/18 with PCI and stent of LCF EF 60% with hypokinesia of the basilar and mid anterolateral segments hypertension hyperlipidemia  last seen 07/13/2018.  Cardiac catheterization report 03/23/2018 showed mild nonobstructive CAD in the other vessels stenosis mid left circumflex no residual stenosis and EF 55 to 65%. Compliance with diet, lifestyle and medications: Yes his wife meticulously follows his pulse heart rate weight at home and he is always normal.  At times his heart rate is in range of 50 bpm we will reduce split his long-acting Toprol taking it twice a day.  He has had no angina edema shortness of breath  palpitation or syncope he is overdue for labs because of COVID-19 I will check his lipids CMP and a hemoglobin A1c which will be available to Dr. Nani Ravens Past Medical History:  Diagnosis Date  . Coronary artery disease   . Depression   . Diabetes mellitus without complication ([place])   . Hypertension   . Stroke [facility])     Past Surgical History:  Procedure Laterality Date  . CORONARY STENT INTERVENTION N/A 03/23/2018   Procedure: CORONARY STENT INTERVENTION;  Surgeon: Jettie Booze, MD;  Location: [place] [facility];  Service: Cardiovascular;  Laterality: N/A;  . LEFT HEART CATH AND CORONARY ANGIOGRAPHY N/A 03/23/2018   Procedure: LEFT HEART CATH AND CORONARY ANGIOGRAPHY;  Surgeon: Jettie Booze, MD;  Location: [place] [facility];  Service: Cardiovascular;  Laterality: N/A;    Current Medications: Current Meds  Medication Sig  . amLODipine (NORVASC) 5 MG tablet Take 1 tablet (5 mg total) by mouth daily.  Marland Kitchen aspirin EC 81 MG EC tablet Take 1 tablet (81 mg total) by mouth daily.  Marland Kitchen atorvastatin (LIPITOR) 80 MG tablet Take 1 tablet (80 mg total) by mouth daily at 6 PM.  . b complex vitamins capsule Take 1 capsule by mouth daily.  Marland Kitchen donepezil (ARICEPT) 5 MG tablet TAKE 1 TABLET (5 MG TOTAL) BY MOUTH AT BEDTIME.  . metFORMIN (GLUCOPHAGE) 500 MG tablet Take 1 tablet (500 mg total) by mouth 2 (two) times daily with a meal.  . metoprolol succinate (TOPROL-XL)  100 MG 24 hr tablet Take 1 tablet (100 mg total) by mouth daily. Take with or immediately following a meal.  . mirtazapine (REMERON) 30 MG tablet Take 1 tablet (30 mg total) by mouth at bedtime.  . Multiple Vitamin (MULTIVITAMIN) tablet Take 1 tablet by mouth daily.  . nitroGLYCERIN (NITROSTAT) 0.4 MG SL tablet Place 1 tablet (0.4 mg total) under the tongue every 5 (five) minutes as needed for chest pain.  . Omega-3 Fatty Acids (FISH OIL) 1000 MG CAPS Take 1 capsule by mouth daily.  . pantoprazole (PROTONIX) 40 MG tablet TAKE 1  TABLET BY MOUTH ONCE DAILY  . ticagrelor (BRILINTA) 90 MG TABS tablet Take 1 tablet (90 mg total) by mouth 2 (two) times daily.  . vitamin C (ASCORBIC ACID) 500 MG tablet Take 500 mg by mouth daily.     Allergies:   Patient has no known allergies.   Social History   Socioeconomic History  . Marital status: Married    Spouse name: Not on file  . Number of children: Not on file  . Years of education: Not on file  . Highest education level: Not on file  Occupational History  . Not on file  Tobacco Use  . Smoking status: Former Smoker    Types: Cigarettes  . Smokeless tobacco: Never Used  . Tobacco comment: quit in 1986  Substance and Sexual Activity  . Alcohol use: Not Currently  . Drug use: Never  . Sexual activity: Not on file  Other Topics Concern  . Not on file  Social History Narrative  . Not on file   Social Determinants of Health   Financial Resource Strain:   . Difficulty of Paying Living Expenses: Not on file  Food Insecurity:   . Worried About Charity fundraiser in the Last Year: Not on file  . Ran Out of Food in the Last Year: Not on file  Transportation Needs:   . Lack of Transportation (Medical): Not on file  . Lack of Transportation (Non-Medical): Not on file  Physical Activity:   . Days of Exercise per Week: Not on file  . Minutes of Exercise per Session: Not on file  Stress:   . Feeling of Stress : Not on file  Social Connections:   . Frequency of Communication with Friends and Family: Not on file  . Frequency of Social Gatherings with Friends and Family: Not on file  . Attends Religious Services: Not on file  . Active Member of Clubs or Organizations: Not on file  . Attends Archivist Meetings: Not on file  . Marital Status: Not on file     Family History: The patient's family history includes Hypertension in his brother. There is no history of Heart attack or Heart disease. ROS:   Please see the history of present illness.    All  other systems reviewed and are negative.  EKGs/Labs/Other Studies Reviewed:    The following studies were reviewed today:  EKG:  EKG ordered today and personally reviewed.  The ekg ordered today demonstrates sinus bradycardia 49 bpm otherwise normal  Recent Labs: 03/22/2018: Magnesium 1.6 04/22/2018: ALT 17; BUN 11; Creatinine, Ser 0.95; Hemoglobin 14.8; Platelets 298.0; Potassium 4.0; Sodium 140  Recent Lipid Panel    Component Value Date/Time   CHOL 89 04/22/2018 0805   TRIG 99.0 04/22/2018 0805   HDL 30.00 (L) 04/22/2018 0805   CHOLHDL 3 04/22/2018 0805   VLDL 19.8 04/22/2018 0805   LDLCALC 39 04/22/2018  0805    Physical Exam:    VS:  BP (!) 120/50 (BP Location: Left Arm, Patient Position: Sitting, Cuff Size: Normal)   Pulse (!) 49   Ht 5\' 6"  (1.676 m)   Wt 143 lb (64.9 kg)   SpO2 96%   BMI 23.08 kg/m     Wt Readings from Last 3 Encounters:  02/10/19 143 lb (64.9 kg)  09/10/18 142 lb 2 oz (64.5 kg)  07/13/18 141 lb (64 kg)    Repeat blood pressure by me 120/50 GEN:  Well nourished, well developed in no acute distress HEENT: Normal NECK: No JVD; No carotid bruits LYMPHATICS: No lymphadenopathy CARDIAC: RRR, no murmurs, rubs, gallops RESPIRATORY:  Clear to auscultation without rales, wheezing or rhonchi  ABDOMEN: Soft, non-tender, non-distended MUSCULOSKELETAL:  No edema; No deformity  SKIN: Warm and dry NEUROLOGIC:  Alert and oriented x 3 PSYCHIATRIC:  Normal affect    Signed, Shirlee More, MD  02/10/2019 10:29 AM    [facility]

## 2019-02-10 ENCOUNTER — Other Ambulatory Visit: Payer: Self-pay

## 2019-02-10 ENCOUNTER — Encounter: Payer: Self-pay | Admitting: Cardiology

## 2019-02-10 ENCOUNTER — Ambulatory Visit (INDEPENDENT_AMBULATORY_CARE_PROVIDER_SITE_OTHER): Payer: Medicare Other | Admitting: Cardiology

## 2019-02-10 VITALS — BP 120/50 | HR 49 | Ht 66.0 in | Wt 143.0 lb

## 2019-02-10 DIAGNOSIS — I2510 Atherosclerotic heart disease of native coronary artery without angina pectoris: Secondary | ICD-10-CM | POA: Diagnosis not present

## 2019-02-10 DIAGNOSIS — I10 Essential (primary) hypertension: Secondary | ICD-10-CM

## 2019-02-10 DIAGNOSIS — E782 Mixed hyperlipidemia: Secondary | ICD-10-CM | POA: Diagnosis not present

## 2019-02-10 DIAGNOSIS — E119 Type 2 diabetes mellitus without complications: Secondary | ICD-10-CM

## 2019-02-10 MED ORDER — METOPROLOL SUCCINATE ER 50 MG PO TB24: 50.0000 mg | ORAL_TABLET | Freq: Two times a day (BID) | ORAL | 1 refills | Status: DC

## 2019-02-10 MED ORDER — CLOPIDOGREL BISULFATE 75 MG PO TABS: ORAL_TABLET | ORAL | 0 refills | Status: DC

## 2019-02-10 NOTE — Patient Instructions (Signed)
Medication Instructions:  Your physician has recommended you make the following change in your medication:   STOP brilinta  START clopidogrel (plavix) 75 mg: Take 2 tablets (150 mg) for the first 2 days then take 1 tablet (75 mg) daily.  CHANGE metoprolol succinate 50 mg: Take 1 tablet twice daily (You can cut the 100 mg tablets in half and take 1/2 tablet twice daily until you run out of these)  *If you need a refill on your cardiac medications before your next appointment, please call your pharmacy*  Lab Work: Your physician recommends that you return for lab work today: lipid panel, CMP, hemoglobin A1c.   If you have labs (blood work) drawn today and your tests are completely normal, you will receive your results only by: Bryan Henderson MyChart Message (if you have MyChart) OR . A paper copy in the mail If you have any lab test that is abnormal or we need to change your treatment, we will call you to review the results.  Testing/Procedures: None  Follow-Up: At [facility], you and your health needs are our priority.  As part of our continuing mission to provide you with exceptional heart care, we have created designated Provider Care Teams.  These Care Teams include your primary Cardiologist (physician) and Advanced Practice Providers (APPs -  Physician Assistants and Nurse Practitioners) who all work together to provide you with the care you need, when you need it.  Your next appointment:   6 month(s)  The format for your next appointment:   In Person  Provider:   Shirlee More, MD    Clopidogrel tablets What is this medicine? CLOPIDOGREL (kloh PID oh grel) helps to prevent blood clots. This medicine is used to prevent heart attack, stroke, or other vascular events in people who are at high risk. This medicine may be used for other purposes; ask your health care provider or pharmacist if you have questions. COMMON BRAND NAME(S): Plavix What should I tell my health care provider  before I take this medicine? They need to know if you have any of the following conditions:  bleeding disorders  bleeding in the brain  having surgery  history of stomach bleeding  an unusual or allergic reaction to clopidogrel, other medicines, foods, dyes, or preservatives  pregnant or trying to get pregnant  breast-feeding How should I use this medicine? Take this medicine by mouth with a glass of water. Follow the directions on the prescription label. You may take this medicine with or without food. If it upsets your stomach, take it with food. Take your medicine at regular intervals. Do not take it Henderson often than directed. Do not stop taking except on your doctor's advice. A special MedGuide will be given to you by the pharmacist with each prescription and refill. Be sure to read this information carefully each time. Talk to your pediatrician regarding the use of this medicine in children. Special care may be needed. Overdosage: If you think you have taken too much of this medicine contact a poison control center or emergency room at once. NOTE: This medicine is only for you. Do not share this medicine with others. What if I miss a dose? If you miss a dose, take it as soon as you can. If it is almost time for your next dose, take only that dose. Do not take double or extra doses. What may interact with this medicine? Do not take this medicine with the following medications:  dasabuvir; ombitasvir; paritaprevir; ritonavir  defibrotide  selexipag This medicine may also interact with the following medications:  certain medicines that treat or prevent blood clots like warfarin  narcotic medicines for pain  NSAIDs, medicines for pain and inflammation, like ibuprofen or naproxen  repaglinide  SNRIs, medicines for depression, like desvenlafaxine, duloxetine, levomilnacipran, venlafaxine  SSRIs, medicines for depression, like citalopram, escitalopram, fluoxetine,  fluvoxamine, paroxetine, sertraline  stomach acid blockers like cimetidine, esomeprazole, omeprazole This list may not describe all possible interactions. Give your health care provider a list of all the medicines, herbs, non-prescription drugs, or dietary supplements you use. Also tell them if you smoke, drink alcohol, or use illegal drugs. Some items may interact with your medicine. What should I watch for while using this medicine? Visit your doctor or health care professional for regular check-ups. Do not stop taking your medicine unless your doctor tells you to. Notify your doctor or health care professional and seek emergency treatment if you develop breathing problems; changes in vision; chest pain; severe, sudden headache; pain, swelling, warmth in the leg; trouble speaking; sudden numbness or weakness of the face, arm or leg. These can be signs that your condition has gotten worse. If you are going to have surgery or dental work, tell your doctor or health care professional that you are taking this medicine. Certain genetic factors may reduce the effect of this medicine. Your doctor may use genetic tests to determine treatment. Only take aspirin if you are instructed to. Low doses of aspirin are used with this medicine to treat some conditions. Taking aspirin with this medicine can increase your risk of bleeding so you must be careful. Talk to your doctor or pharmacist if you have questions. What side effects may I notice from receiving this medicine? Side effects that you should report to your doctor or health care professional as soon as possible:  allergic reactions like skin rash, itching or hives, swelling of the face, lips, or tongue  signs and symptoms of bleeding such as bloody or black, tarry stools; red or dark-brown urine; spitting up blood or brown material that looks like coffee grounds; red spots on the skin; unusual bruising or bleeding from the eye, gums, or nose  signs and  symptoms of a blood clot such as breathing problems; changes in vision; chest pain; severe, sudden headache; pain, swelling, warmth in the leg; trouble speaking; sudden numbness or weakness of the face, arm or leg  signs and symptoms of low blood sugar such as feeling anxious; confusion; dizziness; increased hunger; unusually weak or tired; increased sweating; shakiness; cold, clammy skin; irritable; headache; blurred vision; fast heartbeat; loss of consciousness Side effects that usually do not require medical attention (report to your doctor or health care professional if they continue or are bothersome):  constipation  diarrhea  headache  upset stomach This list may not describe all possible side effects. Call your doctor for medical advice about side effects. You may report side effects to FDA at 1-800-FDA-1088. Where should I keep my medicine? Keep out of the reach of children. Store at room temperature of 59 to 86 degrees F (15 to 30 degrees C). Throw away any unused medicine after the expiration date. NOTE: This sheet is a summary. It may not cover all possible information. If you have questions about this medicine, talk to your doctor, pharmacist, or health care provider.  2020 Elsevier/Gold Standard (2017-07-13 15:03:38)

## 2019-02-11 NOTE — Addendum Note (Signed)
Addended by: Austin Miles on: 02/11/2019 11:13 AM   Modules accepted: Orders

## 2019-02-28 DIAGNOSIS — E119 Type 2 diabetes mellitus without complications: Secondary | ICD-10-CM | POA: Diagnosis not present

## 2019-02-28 DIAGNOSIS — H40003 Preglaucoma, unspecified, bilateral: Secondary | ICD-10-CM | POA: Diagnosis not present

## 2019-02-28 DIAGNOSIS — H1013 Acute atopic conjunctivitis, bilateral: Secondary | ICD-10-CM | POA: Diagnosis not present

## 2019-02-28 LAB — HM DIABETES EYE EXAM

## 2019-03-01 MED FILL — CLOPIDOGREL 75 MG TABLET: 75 | 90 days supply | Qty: 92 | Fill #0

## 2019-03-03 MED FILL — AZELASTINE HCL 0.05% DROPS: 0.05 | 22 days supply | Qty: 6 | Fill #0

## 2019-03-07 MED FILL — AZELASTINE HCL 0.05 % SOLN: 0.05 | 22 days supply | Qty: 6 | Fill #0

## 2019-03-10 ENCOUNTER — Other Ambulatory Visit: Payer: Self-pay

## 2019-03-10 ENCOUNTER — Other Ambulatory Visit (INDEPENDENT_AMBULATORY_CARE_PROVIDER_SITE_OTHER): Payer: Medicare Other

## 2019-03-10 DIAGNOSIS — E119 Type 2 diabetes mellitus without complications: Secondary | ICD-10-CM | POA: Diagnosis not present

## 2019-03-10 DIAGNOSIS — I10 Essential (primary) hypertension: Secondary | ICD-10-CM

## 2019-03-10 LAB — LIPID PANEL
Cholesterol: 78 mg/dL (ref 0–200)
HDL: 35.1 mg/dL — ABNORMAL LOW (ref >39.00)
LDL Cholesterol: 28 mg/dL (ref 0–99)
NonHDL: 43.33
Total CHOL/HDL Ratio: 2
Triglycerides: 78 mg/dL (ref 0.0–149.0)
VLDL: 15.6 mg/dL (ref 0.0–40.0)

## 2019-03-10 LAB — MICROALBUMIN / CREATININE URINE RATIO
Creatinine,U: 65.6 mg/dL
Microalb Creat Ratio: 1.1 mg/g (ref 0.0–30.0)
Microalb, Ur: <0.7 mg/dL (ref 0.0–1.9)

## 2019-03-10 LAB — CBC
HCT: 42.5 % (ref 39.0–52.0)
Hemoglobin: 14.3 g/dL (ref 13.0–17.0)
MCHC: 33.6 g/dL (ref 30.0–36.0)
MCV: 93.8 fl (ref 78.0–100.0)
Platelets: 242 10*3/uL (ref 150.0–400.0)
RBC: 4.53 Mil/uL (ref 4.22–5.81)
RDW: 14.5 % (ref 11.5–15.5)
WBC: 6.9 10*3/uL (ref 4.0–10.5)

## 2019-03-10 LAB — COMPREHENSIVE METABOLIC PANEL
ALT: 24 U/L (ref 0–53)
AST: 18 U/L (ref 0–37)
Albumin: 3.8 g/dL (ref 3.5–5.2)
Alkaline Phosphatase: 69 U/L (ref 39–117)
BUN: 19 mg/dL (ref 6–23)
CO2: 30 mEq/L (ref 19–32)
Calcium: 8.6 mg/dL (ref 8.4–10.5)
Chloride: 103 mEq/L (ref 96–112)
Creatinine, Ser: 0.89 mg/dL (ref 0.40–1.50)
GFR: 83.23 mL/min (ref >60.00)
Glucose, Bld: 112 mg/dL — ABNORMAL HIGH (ref 70–99)
Potassium: 3.6 mEq/L (ref 3.5–5.1)
Sodium: 139 mEq/L (ref 135–145)
Total Bilirubin: 1.3 mg/dL — ABNORMAL HIGH (ref 0.2–1.2)
Total Protein: 6.2 g/dL (ref 6.0–8.3)

## 2019-03-10 LAB — HEMOGLOBIN A1C: Hgb A1c MFr Bld: 6.1 % (ref 4.6–6.5)

## 2019-03-11 ENCOUNTER — Telehealth: Payer: Self-pay | Admitting: Family Medicine

## 2019-03-11 NOTE — Telephone Encounter (Signed)
Copied from [place] [id number]. Topic: Quick Sport and exercise psychologist Patient (Clinic Use ONLY) >> Mar 11, 2019  7:26 AM Lennox Solders wrote: Reason for CRM:pt wife is returning Nyara Capell call concerning blood work results   See result notes

## 2019-03-15 ENCOUNTER — Other Ambulatory Visit: Payer: Self-pay

## 2019-03-15 ENCOUNTER — Other Ambulatory Visit: Payer: Self-pay | Admitting: Family Medicine

## 2019-03-15 MED FILL — ATORVASTATIN 80 MG TABLET: 80 | 90 days supply | Qty: 90 | Fill #3

## 2019-03-15 MED FILL — MIRTAZAPINE 30 MG TABLET: 30 | 90 days supply | Qty: 90 | Fill #0

## 2019-03-16 ENCOUNTER — Ambulatory Visit (INDEPENDENT_AMBULATORY_CARE_PROVIDER_SITE_OTHER): Payer: Medicare Other | Admitting: Family Medicine

## 2019-03-16 ENCOUNTER — Encounter: Payer: Self-pay | Admitting: Family Medicine

## 2019-03-16 VITALS — BP 108/60 | HR 54 | Temp 96.7°F | Ht 66.0 in | Wt 147.0 lb

## 2019-03-16 DIAGNOSIS — I25119 Atherosclerotic heart disease of native coronary artery with unspecified angina pectoris: Secondary | ICD-10-CM | POA: Diagnosis not present

## 2019-03-16 DIAGNOSIS — Z0000 Encounter for general adult medical examination without abnormal findings: Secondary | ICD-10-CM | POA: Diagnosis not present

## 2019-03-16 NOTE — Progress Notes (Signed)
Chief Complaint  Patient presents with  . Follow-up    Well Male Bryan Henderson is here for a complete physical.   His last physical was >1 year ago.  Current diet: in general, a "healthy" diet.   Current exercise: walking Weight trend: stable Daytime fatigue? No. Seat belt? Yes.    Health maintenance Shingrix- Yes Tetanus- Yes Pneumonia vaccine- Yes  Past Medical History:  Diagnosis Date  . Coronary artery disease   . Depression   . Diabetes mellitus without complication ([place])   . Hypertension   . Stroke [facility])      Past Surgical History:  Procedure Laterality Date  . CORONARY STENT INTERVENTION N/A 03/23/2018   Procedure: CORONARY STENT INTERVENTION;  Surgeon: Jettie Booze, MD;  Location: [place] [facility];  Service: Cardiovascular;  Laterality: N/A;  . LEFT HEART CATH AND CORONARY ANGIOGRAPHY N/A 03/23/2018   Procedure: LEFT HEART CATH AND CORONARY ANGIOGRAPHY;  Surgeon: Jettie Booze, MD;  Location: [place] [facility];  Service: Cardiovascular;  Laterality: N/A;    Medications  Current Outpatient Medications on File Prior to Visit  Medication Sig Dispense Refill  . amLODipine (NORVASC) 5 MG tablet Take 1 tablet (5 mg total) by mouth daily. 90 tablet 1  . aspirin EC 81 MG EC tablet Take 1 tablet (81 mg total) by mouth daily.    Marland Kitchen atorvastatin (LIPITOR) 80 MG tablet Take 1 tablet (80 mg total) by mouth daily at 6 PM. 90 tablet 1  . b complex vitamins capsule Take 1 capsule by mouth daily.    . clopidogrel (PLAVIX) 75 MG tablet Take 2 tablets (150 mg) for the first 2 days then take 1 tablet (75 mg) daily. 92 tablet 0  . donepezil (ARICEPT) 5 MG tablet TAKE 1 TABLET (5 MG TOTAL) BY MOUTH AT BEDTIME. 90 tablet 2  . metFORMIN (GLUCOPHAGE) 500 MG tablet Take 1 tablet (500 mg total) by mouth 2 (two) times daily with a meal. 180 tablet 1  . metoprolol succinate (TOPROL-XL) 50 MG 24 hr tablet Take 1 tablet (50 mg total) by mouth 2 (two) times daily. Take with or  immediately following a meal. 180 tablet 1  . mirtazapine (REMERON) 30 MG tablet TAKE 1 TABLET BY MOUTH AT BEDTIME 90 tablet 1  . Multiple Vitamin (MULTIVITAMIN) tablet Take 1 tablet by mouth daily.    . nitroGLYCERIN (NITROSTAT) 0.4 MG SL tablet Place 1 tablet (0.4 mg total) under the tongue every 5 (five) minutes as needed for chest pain. 25 tablet 0  . Omega-3 Fatty Acids (FISH OIL) 1000 MG CAPS Take 1 capsule by mouth daily.    . pantoprazole (PROTONIX) 40 MG tablet TAKE 1 TABLET BY MOUTH ONCE DAILY 90 tablet 0  . vitamin C (ASCORBIC ACID) 500 MG tablet Take 500 mg by mouth daily.     Allergies No Known Allergies  Family History Family History  Problem Relation Age of Onset  . Hypertension Brother   . Heart attack Neg Hx   . Heart disease Neg Hx     Review of Systems: Constitutional:  no fevers or chills Eye:  no recent significant change in vision Ear/Nose/Mouth/Throat:  Ears:  no recent hearing loss Nose/Mouth/Throat:  no complaints of nasal congestion or sore throat Cardiovascular:  no chest pain Respiratory:  no shortness of breath Gastrointestinal:  no abdominal pain, no change in bowel habits GU:  Male: negative for dysuria, frequency, and incontinence  Musculoskeletal/Extremities:  no pain of the joints Integumentary (  Skin):  no abnormal skin lesions reported Neurologic:  no headaches, Endocrine:  No unexpected weight changes Hematologic/Lymphatic:  +easy bruising  Exam BP 108/60 (BP Location: Left Arm, Patient Position: Sitting, Cuff Size: Normal)   Pulse (!) 54   Temp (!) 96.7 F (35.9 C) (Temporal)   Ht 5\' 6"  (1.676 m)   Wt 147 lb (66.7 kg)   SpO2 94%   BMI 23.73 kg/m  General:  well developed, well nourished, in no apparent distress Skin:  no significant moles, warts, or growths Head:  no masses, lesions, or tenderness Eyes:  pupils equal and round, sclera anicteric without injection Ears:  canals without lesions, TMs shiny without retraction, no obvious  effusion, no erythema Nose:  nares patent, septum midline, mucosa normal Throat/Pharynx:  lips and gingiva without lesion; tongue and uvula midline; non-inflamed pharynx; no exudates or postnasal drainage Neck: neck supple without adenopathy, thyromegaly, or masses Lungs:  clear to auscultation, breath sounds equal bilaterally, no respiratory distress Cardio:  regular rate and rhythm, no LE edema or bruits Rectal: Deferred Musculoskeletal:  symmetrical muscle groups noted without atrophy or deformity Extremities:  no clubbing, cyanosis, or edema, no deformities, no skin discoloration Neuro:  gait normal; deep tendon reflexes normal and symmetric Psych: well oriented with normal range of affect and appropriate judgment/insight  Assessment and Plan  Well adult exam  Coronary artery disease involving native coronary artery of native heart with angina pectoris ([place])   Well 74 y.o. male. Counseled on diet and exercise. Other orders as above. Reassurance with medicines, cont ASA given CAD.  Follow up in 6 mo or prn.  The patient voiced understanding and agreement to the plan.  [place], DO 03/16/19 10:03 AM

## 2019-03-16 NOTE — Patient Instructions (Addendum)
Keep the diet clean and stay active.  For the swelling in your lower extremities, be sure to elevate your legs when able, mind the salt intake, stay physically active and consider wearing compression stockings.  Continue the aspirin.   For the COVID vaccine, call [phone] or [email or web address].[facility].org/.  Let us know if you need anything.

## 2019-03-24 IMAGING — DX DG CHEST 2V
2 series · 2 of 2 positions shown · non-contrast
Comparison: None.

CLINICAL DATA: Central chest pain for 2 days

EXAM:
CHEST - 2 VIEW

[chest pa]
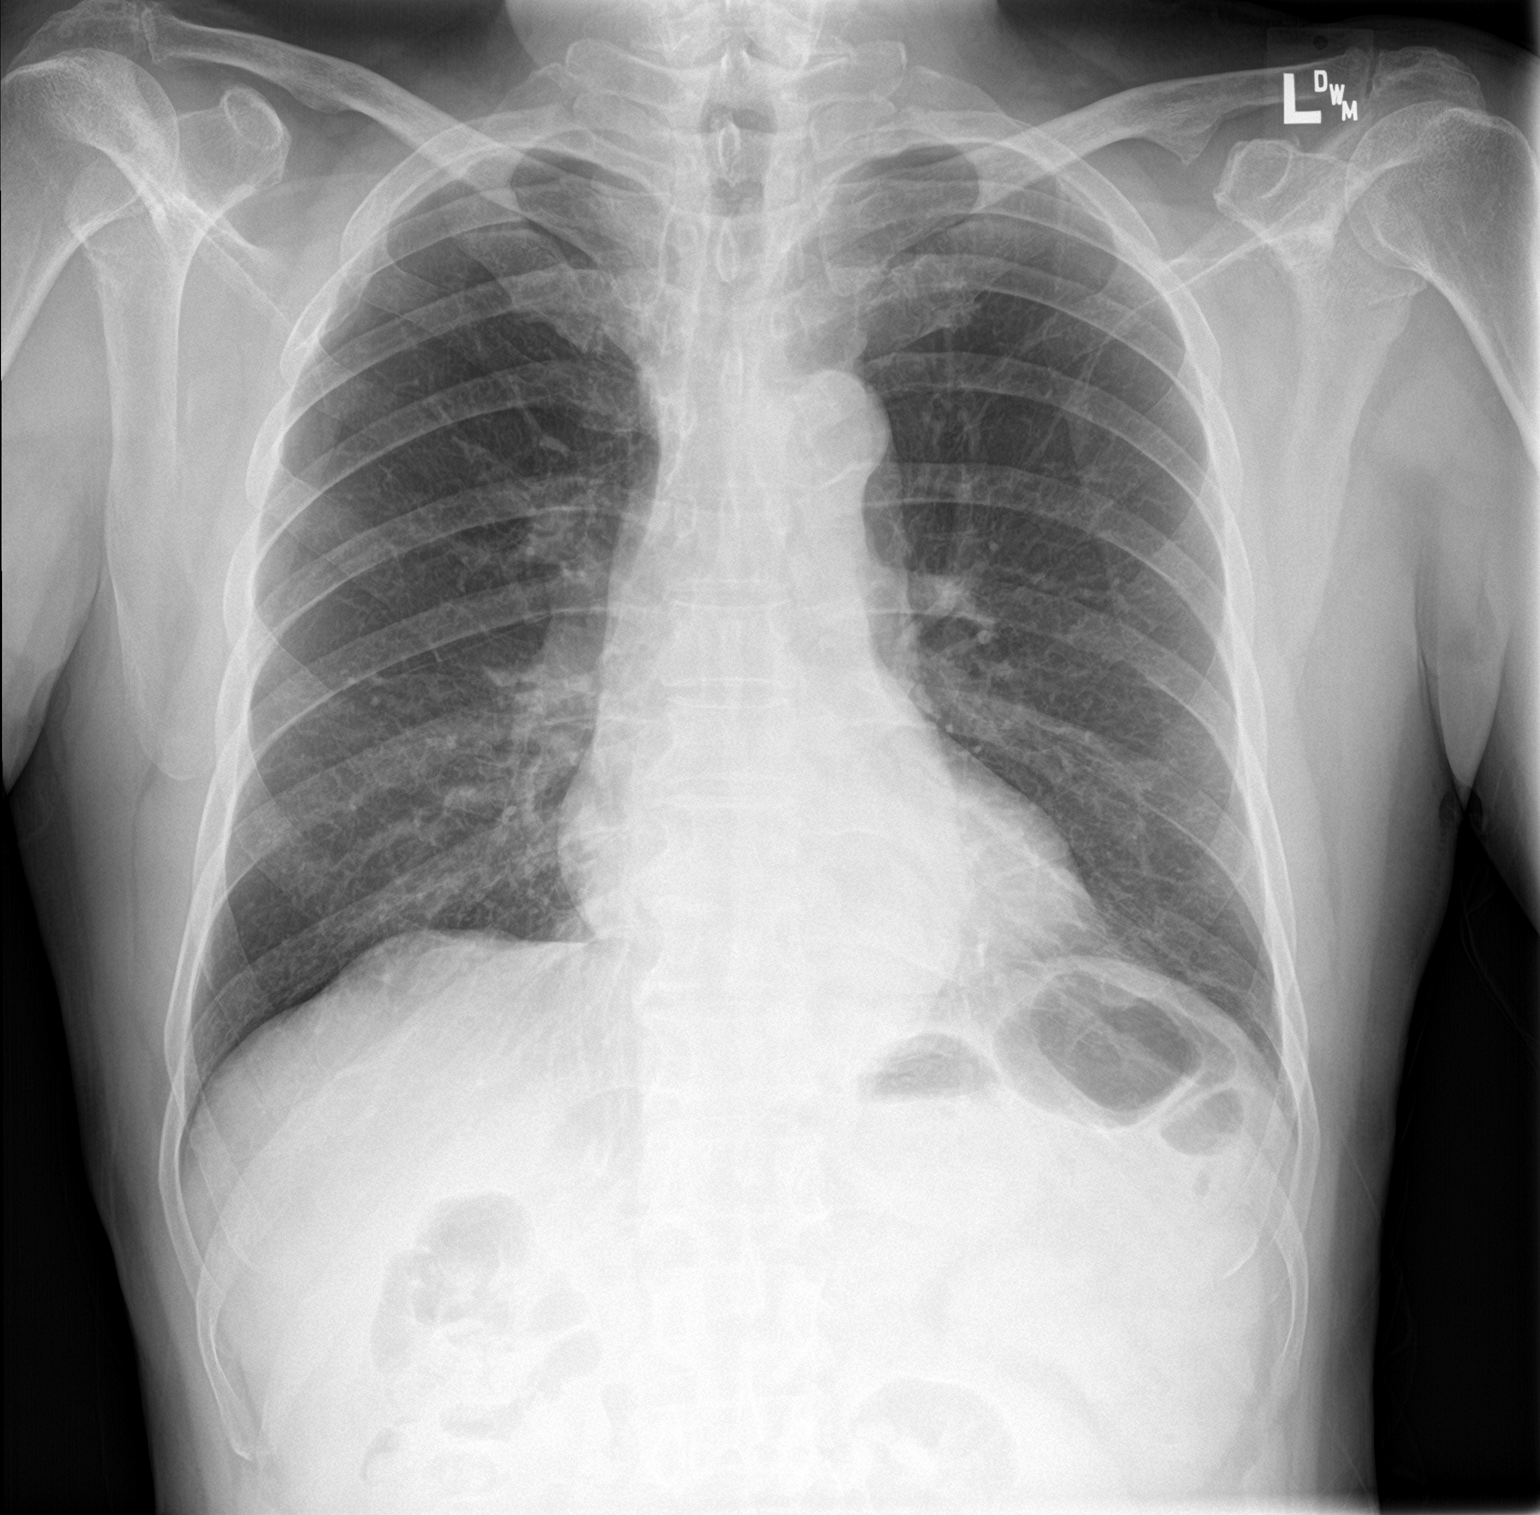

[chest lat]
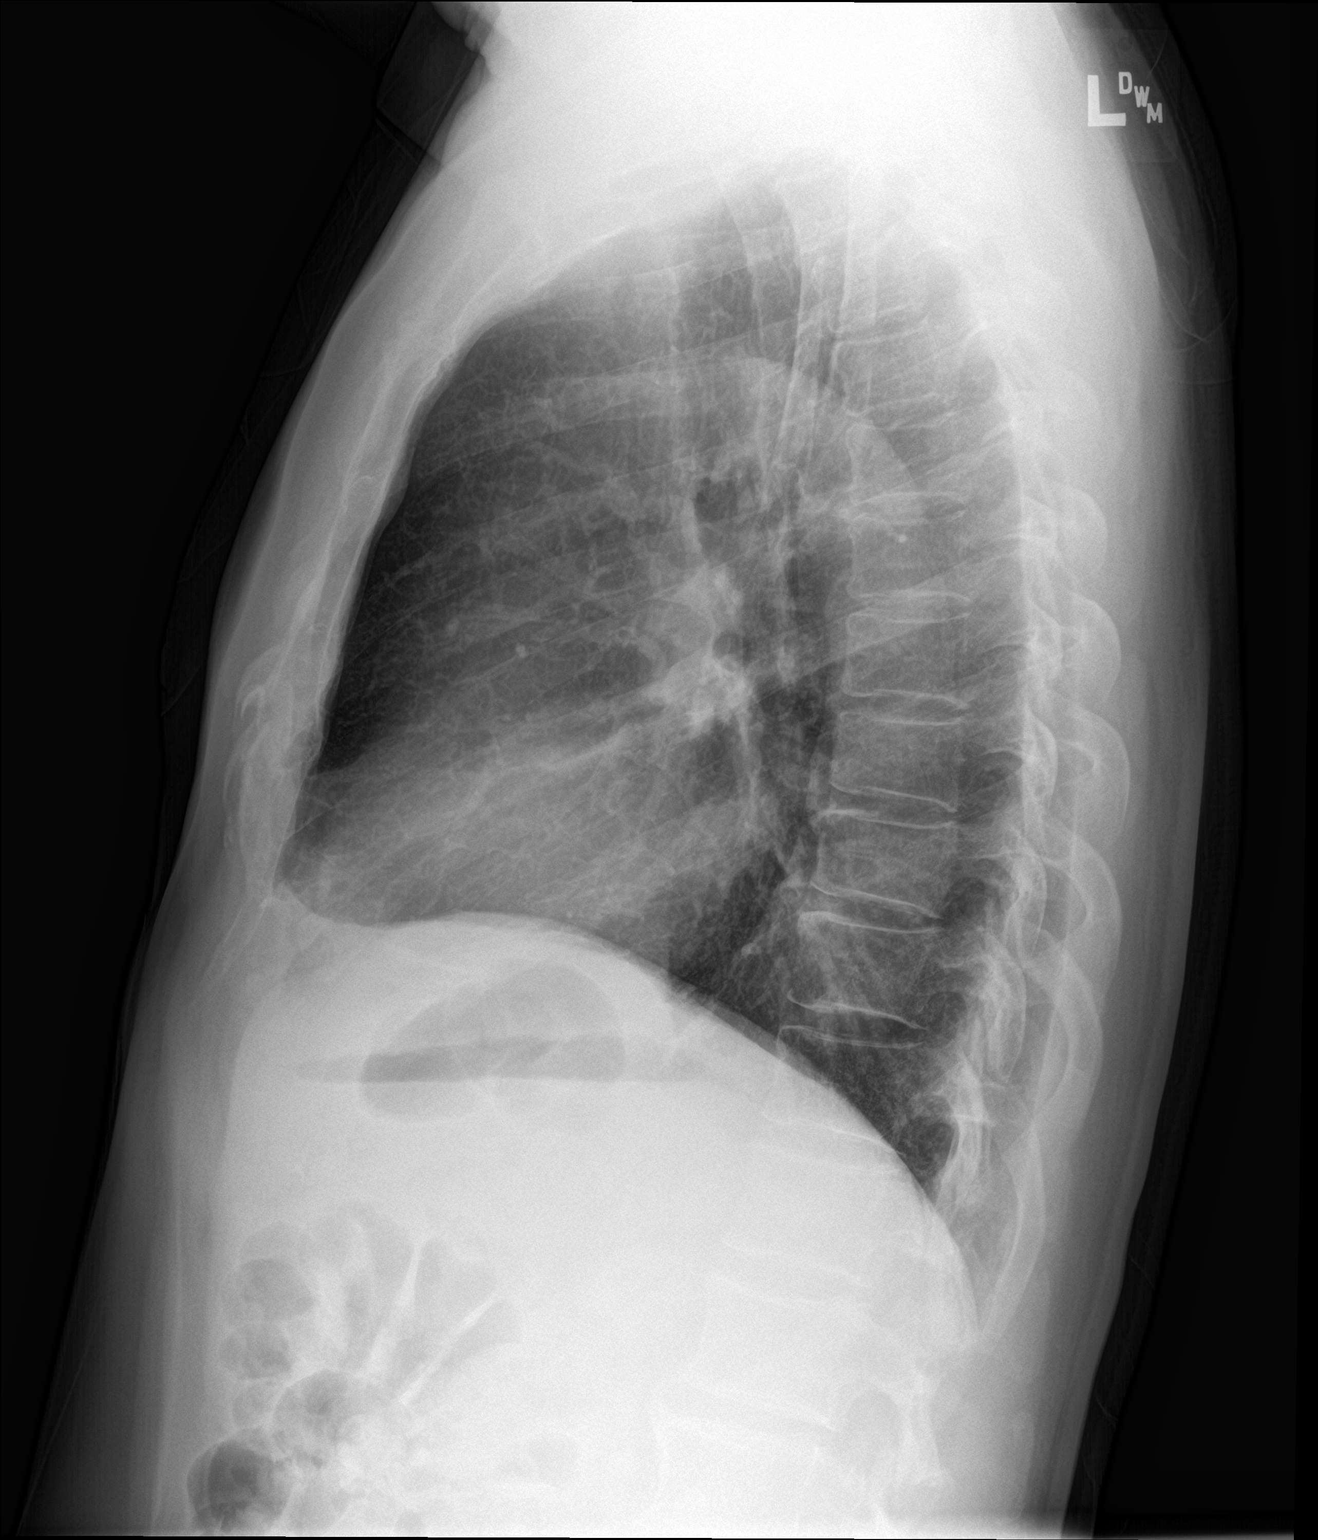

[2 of 2 positions shown; findings below may reference images not displayed]

FINDINGS: The heart size and mediastinal contours are within normal limits.
Both lungs are clear. The visualized skeletal structures are
unremarkable.
IMPRESSION: No active cardiopulmonary disease.

## 2019-04-04 ENCOUNTER — Ambulatory Visit: Payer: Medicare Other | Attending: Internal Medicine

## 2019-04-04 DIAGNOSIS — Z23 Encounter for immunization: Secondary | ICD-10-CM

## 2019-04-04 NOTE — Progress Notes (Signed)
   Covid-19 Vaccination Clinic  Name:  Bryan Henderson    MRN: [id number] DOB: Oct 14, 1945  04/04/2019  Mr. [name] was observed post Covid-19 immunization for 15 minutes without incidence. He was provided with Vaccine Information Sheet and instruction to access the V-Safe system.   [name] was instructed to call 911 with any severe reactions post vaccine: Marland Kitchen Difficulty breathing  . Swelling of your face and throat  . A fast heartbeat  . A bad rash all over your body  . Dizziness and weakness    Immunizations Administered    Name Date Dose VIS Date Route   Pfizer COVID-19 Vaccine 04/04/2019  4:00 PM 0.3 mL 02/04/2019 Intramuscular   Manufacturer: [place]   Lot: [id number]   [place]: [id number]

## 2019-04-05 ENCOUNTER — Other Ambulatory Visit: Payer: Self-pay | Admitting: Family Medicine

## 2019-04-05 MED FILL — metFORMIN HCL 500 MG TABS: 500 | 90 days supply | Qty: 180 | Fill #0

## 2019-04-27 ENCOUNTER — Other Ambulatory Visit: Payer: Self-pay | Admitting: Cardiology

## 2019-04-27 ENCOUNTER — Other Ambulatory Visit: Payer: Self-pay | Admitting: Family Medicine

## 2019-04-27 MED FILL — METOPROLOL SUCCINATE ER 50: 50 | 90 days supply | Qty: 180 | Fill #0

## 2019-04-27 MED FILL — DONEPEZIL HCL 5 MG TABLET: 5 | 90 days supply | Qty: 90 | Fill #1

## 2019-04-27 MED FILL — AMLODIPINE BESYLATE 5 MG TA: 5 | 90 days supply | Qty: 90 | Fill #0

## 2019-04-27 MED FILL — PANTOPRAZOLE SOD DR 40 MG T: 40 | 90 days supply | Qty: 90 | Fill #0

## 2019-04-29 ENCOUNTER — Ambulatory Visit: Payer: Medicare Other | Attending: Internal Medicine

## 2019-04-29 ENCOUNTER — Other Ambulatory Visit: Payer: Self-pay

## 2019-04-29 DIAGNOSIS — Z23 Encounter for immunization: Secondary | ICD-10-CM | POA: Insufficient documentation

## 2019-04-29 NOTE — Progress Notes (Signed)
   Covid-19 Vaccination Clinic  Name:  Bryan Henderson    MRN: [id number] DOB: 1945-06-19  04/29/2019  Bryan Henderson was observed post Covid-19 immunization for 15 minutes without incident. He was provided with Vaccine Information Sheet and instruction to access the V-Safe system.   Bryan Henderson was instructed to call 911 with any severe reactions post vaccine: Marland Kitchen Difficulty breathing  . Swelling of face and throat  . A fast heartbeat  . A bad rash all over body  . Dizziness and weakness   Immunizations Administered    Name Date Dose VIS Date Route   Pfizer COVID-19 Vaccine 04/29/2019 12:38 PM 0.3 mL 02/04/2019 Intramuscular   Manufacturer: [place]   Lot: [id number]   [place]: [id number]

## 2019-05-23 ENCOUNTER — Other Ambulatory Visit: Payer: Self-pay | Admitting: Cardiology

## 2019-05-25 ENCOUNTER — Ambulatory Visit: Payer: Medicare Other

## 2019-06-01 MED FILL — CLOPIDOGREL 75 MG TABLET: 75 | 30 days supply | Qty: 30 | Fill #0

## 2019-06-03 ENCOUNTER — Other Ambulatory Visit: Payer: Self-pay

## 2019-06-28 ENCOUNTER — Other Ambulatory Visit: Payer: Self-pay | Admitting: Cardiology

## 2019-06-28 MED FILL — CLOPIDOGREL 75 MG TABLET: 75 | 30 days supply | Qty: 30 | Fill #0

## 2019-06-28 MED FILL — METFORMIN HCL 500 MG TABS: 500 | 90 days supply | Qty: 180 | Fill #1

## 2019-06-28 MED FILL — MIRTAZAPINE 30 MG TABLET: 30 | 90 days supply | Qty: 90 | Fill #1

## 2019-06-29 MED FILL — ATORVASTATIN 80 MG TABLET: 80 | 90 days supply | Qty: 90 | Fill #0

## 2019-07-26 ENCOUNTER — Other Ambulatory Visit: Payer: Self-pay | Admitting: Family Medicine

## 2019-07-26 ENCOUNTER — Other Ambulatory Visit: Payer: Self-pay | Admitting: Cardiology

## 2019-07-26 MED FILL — CLOPIDOGREL 75 MG TABLET: 75 | 90 days supply | Qty: 90 | Fill #0

## 2019-07-26 MED FILL — AMLODIPINE BESYLATE 5 MG TA: 5 | 90 days supply | Qty: 90 | Fill #1

## 2019-07-26 MED FILL — DONEPEZIL HCL 5 MG TABLET: 5 | 90 days supply | Qty: 90 | Fill #2

## 2019-07-26 MED FILL — PANTOPRAZOLE SOD DR 40 MG T: 40 | 90 days supply | Qty: 90 | Fill #0

## 2019-07-26 MED FILL — METOPROLOL SUCCINATE ER 50: 50 | 90 days supply | Qty: 180 | Fill #1

## 2019-07-29 ENCOUNTER — Telehealth: Payer: Self-pay

## 2019-08-08 ENCOUNTER — Other Ambulatory Visit: Payer: Self-pay

## 2019-08-14 NOTE — Progress Notes (Signed)
Cardiology Office Note:    Date:  08/15/2019   ID:  Bryan Henderson, DOB August 12, 1945, MRN [id number]  PCP:  Shelda Pal, DO  Cardiologist:  Shirlee More, MD    Referring MD: Shelda Pal*    ASSESSMENT:    1. Coronary artery disease involving native coronary artery of native heart without angina pectoris   2. Essential hypertension   3. Mixed hyperlipidemia    PLAN:    In order of problems listed above:  1. Stable CAD continue medical therapy we will titrate to single antiplatelet agent clopidogrel discontinue his PPI and continue beta-blocker and high intensity statin. 2. BP at target continue current treatment 3. Continue his high intensity statin fish oil check lipid profile liver function renal function and potassium   Next appointment: 6 months   Medication Adjustments/Labs and Tests Ordered: Current medicines are reviewed at length with the patient today.  Concerns regarding medicines are outlined above.  No orders of the defined types were placed in this encounter.  No orders of the defined types were placed in this encounter.   Chief Complaint  Patient presents with  . Follow-up  . Coronary Artery Disease    History of Present Illness:    Bryan Henderson is a 74 y.o. male with a hx of CAD nonSTEMI 03/22/18 with PCI and stent of LCF EF 60% with hypokinesia of the basilar and mid anterolateral segments hypertension hyperlipidemia  last seen 02/10/2020 and transitioned from brillinta to Clopidogrel. Compliance with diet, lifestyle and medications: Yes  He continues to do well exercises 3 hours a week.  He has had no angina dyspnea palpitation or syncope.  They are concerned about bleeding risk more than a year remote from PCI and will drop aspirin and if is not taking dual antiplatelet therapy will discontinue Protonix. Past Medical History:  Diagnosis Date  . Coronary artery disease   . Depression   . Diabetes mellitus without complication  ([place])   . Hypertension   . Stroke [facility])     Past Surgical History:  Procedure Laterality Date  . CORONARY STENT INTERVENTION N/A 03/23/2018   Procedure: CORONARY STENT INTERVENTION;  Surgeon: Jettie Booze, MD;  Location: [facility];  Service: Cardiovascular;  Laterality: N/A;  . LEFT HEART CATH AND CORONARY ANGIOGRAPHY N/A 03/23/2018   Procedure: LEFT HEART CATH AND CORONARY ANGIOGRAPHY;  Surgeon: Jettie Booze, MD;  Location: [facility];  Service: Cardiovascular;  Laterality: N/A;    Current Medications: Current Meds  Medication Sig  . amLODipine (NORVASC) 5 MG tablet TAKE 1 TABLET (5 MG TOTAL) BY MOUTH DAILY.  Marland Kitchen aspirin EC 81 MG EC tablet Take 1 tablet (81 mg total) by mouth daily.  Marland Kitchen atorvastatin (LIPITOR) 80 MG tablet TAKE 1 TABLET BY MOUTH DAILY AT 6 PM.  . b complex vitamins capsule Take 1 capsule by mouth daily.  . clopidogrel (PLAVIX) 75 MG tablet TAKE 1 TABLET (75 MG) BY MOUTH DAILY. PLEASE CALL OFFICE TO SCHEDULE AN APPOINTMENT  . donepezil (ARICEPT) 5 MG tablet TAKE 1 TABLET (5 MG TOTAL) BY MOUTH AT BEDTIME.  . metFORMIN (GLUCOPHAGE) 500 MG tablet TAKE 1 TABLET BY MOUTH TWICE DAILY WITH A MEAL  . metoprolol succinate (TOPROL-XL) 50 MG 24 hr tablet Take 1 tablet (50 mg total) by mouth 2 (two) times daily. Take with or immediately following a meal.  . mirtazapine (REMERON) 30 MG tablet TAKE 1 TABLET BY MOUTH AT BEDTIME  . Multiple Vitamin (MULTIVITAMIN)  tablet Take 1 tablet by mouth daily.  . nitroGLYCERIN (NITROSTAT) 0.4 MG SL tablet Place 1 tablet (0.4 mg total) under the tongue every 5 (five) minutes as needed for chest pain.  . Omega-3 Fatty Acids (FISH OIL) 1000 MG CAPS Take 1 capsule by mouth daily.  . pantoprazole (PROTONIX) 40 MG tablet TAKE 1 TABLET BY MOUTH ONCE DAILY  . vitamin C (ASCORBIC ACID) 500 MG tablet Take 500 mg by mouth daily.     Allergies:   Patient has no known allergies.   Social History   Socioeconomic History  .  Marital status: Married    Spouse name: Not on file  . Number of children: Not on file  . Years of education: Not on file  . Highest education level: Not on file  Occupational History  . Not on file  Tobacco Use  . Smoking status: Former Smoker    Types: Cigarettes  . Smokeless tobacco: Never Used  . Tobacco comment: quit in 1986  Vaping Use  . Vaping Use: Never used  Substance and Sexual Activity  . Alcohol use: Not Currently  . Drug use: Never  . Sexual activity: Not on file  Other Topics Concern  . Not on file  Social History Narrative  . Not on file   Social Determinants of Health   Financial Resource Strain:   . Difficulty of Paying Living Expenses:   Food Insecurity:   . Worried About Charity fundraiser in the Last Year:   . Arboriculturist in the Last Year:   Transportation Needs:   . Film/video editor (Medical):   Marland Kitchen Lack of Transportation (Non-Medical):   Physical Activity:   . Days of Exercise per Week:   . Minutes of Exercise per Session:   Stress:   . Feeling of Stress :   Social Connections:   . Frequency of Communication with Friends and Family:   . Frequency of Social Gatherings with Friends and Family:   . Attends Religious Services:   . Active Member of Clubs or Organizations:   . Attends Archivist Meetings:   Marland Kitchen Marital Status:      Family History: The patient's family history includes Hypertension in his brother. There is no history of Heart attack or Heart disease. ROS:   Please see the history of present illness.    All other systems reviewed and are negative.  EKGs/Labs/Other Studies Reviewed:    The following studies were reviewed today:    Recent Labs: 03/10/2019: ALT 24; BUN 19; Creatinine, Ser 0.89; Hemoglobin 14.3; Platelets 242.0; Potassium 3.6; Sodium 139  Recent Lipid Panel    Component Value Date/Time   CHOL 78 03/10/2019 0807   TRIG 78.0 03/10/2019 0807   HDL 35.10 (L) 03/10/2019 0807   CHOLHDL 2  03/10/2019 0807   VLDL 15.6 03/10/2019 0807   LDLCALC 28 03/10/2019 0807    Physical Exam:    VS:  BP 140/64   Pulse (!) 56   Ht 5\' 6"  (1.676 m)   Wt 146 lb (66.2 kg)   SpO2 98%   BMI 23.57 kg/m     Wt Readings from Last 3 Encounters:  08/15/19 146 lb (66.2 kg)  03/16/19 147 lb (66.7 kg)  02/10/19 143 lb (64.9 kg)     GEN:  Well nourished, well developed in no acute distress HEENT: Normal NECK: No JVD; No carotid bruits LYMPHATICS: No lymphadenopathy CARDIAC: RRR, no murmurs, rubs, gallops RESPIRATORY:  Clear to  auscultation without rales, wheezing or rhonchi  ABDOMEN: Soft, non-tender, non-distended MUSCULOSKELETAL:  No edema; No deformity  SKIN: Warm and dry NEUROLOGIC:  Alert and oriented x 3 PSYCHIATRIC:  Normal affect    Signed, Shirlee More, MD  08/15/2019 1:46 PM    [facility]

## 2019-08-15 ENCOUNTER — Encounter: Payer: Self-pay | Admitting: Cardiology

## 2019-08-15 ENCOUNTER — Ambulatory Visit: Payer: Medicare Other | Admitting: Cardiology

## 2019-08-15 ENCOUNTER — Other Ambulatory Visit: Payer: Self-pay

## 2019-08-15 VITALS — BP 140/64 | HR 56 | Ht 66.0 in | Wt 146.0 lb

## 2019-08-15 DIAGNOSIS — I2510 Atherosclerotic heart disease of native coronary artery without angina pectoris: Secondary | ICD-10-CM | POA: Diagnosis not present

## 2019-08-15 DIAGNOSIS — I10 Essential (primary) hypertension: Secondary | ICD-10-CM

## 2019-08-15 DIAGNOSIS — E782 Mixed hyperlipidemia: Secondary | ICD-10-CM

## 2019-08-15 NOTE — Patient Instructions (Signed)
Medication Instructions:  Your physician has recommended you make the following change in your medication:  STOP: Aspirin STOP: Protonix *If you need a refill on your cardiac medications before your next appointment, please call your pharmacy*   Lab Work: Your physician recommends that you return for lab work in: [place], Lipids If you have labs (blood work) drawn today and your tests are completely normal, you will receive your results only by: Marland Kitchen MyChart Message (if you have MyChart) OR . A paper copy in the mail If you have any lab test that is abnormal or we need to change your treatment, we will call you to review the results.   Testing/Procedures: None   Follow-Up: At [facility], you and your health needs are our priority.  As part of our continuing mission to provide you with exceptional heart care, we have created designated Provider Care Teams.  These Care Teams include your primary Cardiologist (physician) and Advanced Practice Providers (APPs -  Physician Assistants and Nurse Practitioners) who all work together to provide you with the care you need, when you need it.  We recommend signing up for the patient portal called "MyChart".  Sign up information is provided on this After Visit Summary.  MyChart is used to connect with patients for Virtual Visits (Telemedicine).  Patients are able to view lab/test results, encounter notes, upcoming appointments, etc.  Non-urgent messages can be sent to your provider as well.   To learn more about what you can do with MyChart, go to [email or web address].    Your next appointment:   6 month(s)  The format for your next appointment:   In Person  Provider:   Shirlee More, MD   Other Instructions

## 2019-08-31 ENCOUNTER — Telehealth: Payer: Self-pay | Admitting: Family Medicine

## 2019-08-31 ENCOUNTER — Other Ambulatory Visit: Payer: Self-pay | Admitting: Family Medicine

## 2019-08-31 DIAGNOSIS — E119 Type 2 diabetes mellitus without complications: Secondary | ICD-10-CM

## 2019-08-31 DIAGNOSIS — I10 Essential (primary) hypertension: Secondary | ICD-10-CM

## 2019-08-31 NOTE — Telephone Encounter (Signed)
Caller: Reuben Knoblock Call Back # [phone]  Patient is requesting blood drawn prior to doctor appointment on 09/20/2019. Patient would like to discuss lab results with Dr. Nani Ravens . Patient states that Dr.Munley has ordered lab work and would like to the blood work done together.     Please Advise

## 2019-08-31 NOTE — Telephone Encounter (Signed)
Called informed the patient/wife ok and put in the order/lab appointment made already.

## 2019-08-31 NOTE — Telephone Encounter (Signed)
Add a CBC and A1c plz. Ty.

## 2019-08-31 NOTE — Telephone Encounter (Signed)
Let me know if ok and what to order

## 2019-09-13 ENCOUNTER — Other Ambulatory Visit: Payer: Self-pay

## 2019-09-13 ENCOUNTER — Other Ambulatory Visit (INDEPENDENT_AMBULATORY_CARE_PROVIDER_SITE_OTHER): Payer: Medicare Other

## 2019-09-13 DIAGNOSIS — E119 Type 2 diabetes mellitus without complications: Secondary | ICD-10-CM | POA: Diagnosis not present

## 2019-09-13 DIAGNOSIS — I10 Essential (primary) hypertension: Secondary | ICD-10-CM | POA: Diagnosis not present

## 2019-09-13 LAB — CBC
HCT: 44.4 % (ref 39.0–52.0)
Hemoglobin: 15.2 g/dL (ref 13.0–17.0)
MCHC: 34.2 g/dL (ref 30.0–36.0)
MCV: 92.2 fl (ref 78.0–100.0)
Platelets: 226 10*3/uL (ref 150.0–400.0)
RBC: 4.82 Mil/uL (ref 4.22–5.81)
RDW: 13.7 % (ref 11.5–15.5)
WBC: 7 10*3/uL (ref 4.0–10.5)

## 2019-09-13 LAB — HEMOGLOBIN A1C: Hgb A1c MFr Bld: 6.3 % (ref 4.6–6.5)

## 2019-09-16 ENCOUNTER — Ambulatory Visit: Payer: Medicare Other | Admitting: Family Medicine

## 2019-09-20 ENCOUNTER — Other Ambulatory Visit: Payer: Self-pay

## 2019-09-20 ENCOUNTER — Ambulatory Visit (INDEPENDENT_AMBULATORY_CARE_PROVIDER_SITE_OTHER): Payer: Medicare Other | Admitting: Family Medicine

## 2019-09-20 ENCOUNTER — Encounter: Payer: Self-pay | Admitting: Family Medicine

## 2019-09-20 VITALS — BP 118/70 | HR 53 | Temp 98.4°F | Ht 66.0 in | Wt 146.0 lb

## 2019-09-20 DIAGNOSIS — E1165 Type 2 diabetes mellitus with hyperglycemia: Secondary | ICD-10-CM

## 2019-09-20 DIAGNOSIS — M545 Low back pain, unspecified: Secondary | ICD-10-CM

## 2019-09-20 DIAGNOSIS — I10 Essential (primary) hypertension: Secondary | ICD-10-CM | POA: Diagnosis not present

## 2019-09-20 NOTE — Addendum Note (Signed)
Addended by: Sharon Seller B on: 09/20/2019 04:20 PM   Modules accepted: Orders

## 2019-09-20 NOTE — Progress Notes (Signed)
Subjective:   Chief Complaint  Patient presents with  . Follow-up    Bryan Henderson is a 74 y.o. male here for follow-up of diabetes.  Here w spouse who helps interpret.  Icker' does not routinely ck sugars.  A1c 6.3. Patient does not require insulin.   Medications include: Metformin  Diet is fair.  Exercise: lifting, walking 30 min/d  R sided LBP over past sev mo. No inj or change in activity. More tightness. Has not tried anything at home. No bruising, swelling, redness, neurologic s/s's.   Past Medical History:  Diagnosis Date  . Coronary artery disease   . Depression   . Diabetes mellitus without complication ([place])   . Hypertension   . Stroke [facility])      Related testing: Date of retinal exam: *Done Pneumovax: done  Objective:  BP 118/70 (BP Location: Left Arm, Patient Position: Sitting, Cuff Size: Normal)   Pulse 53   Temp 98.4 F (36.9 C) (Oral)   Ht 5\' 6"  (1.676 m)   Wt 146 lb (66.2 kg)   SpO2 95%   BMI 23.57 kg/m  General:  Well developed, well nourished, in no apparent distress Skin:  Warm, no pallor or diaphoresis Head:  Normocephalic, atraumatic Eyes:  Pupils equal and round, sclera anicteric without injection  Lungs:  CTAB, no access msc use Cardio:  RRR, no bruits, no LE edema Musculoskeletal:  Symmetrical muscle groups noted without atrophy or deformity; no ttp in lumbar region, points to ES group distally. Tight hamstrings b/l, neg straight leg b/l Neuro:  Sensation intact to pinprick on feet, DTR's equal and symmetric b/l, no cerebellar signs. Psych: Nml mood  Assessment:   Type 2 diabetes mellitus with hyperglycemia, without long-term current use of insulin (HCC) - Plan: Lipid panel, Comprehensive metabolic panel  Essential hypertension  Right-sided low back pain without sciatica, unspecified chronicity   Plan:   1. Controlled. Cont Metformin. Counseled on diet and exercise. 2. Controlled. 3. Stretches/exercises. Suspect ES group strain.  If no improvement, PT.  F/u in 6 mo. The patient and spouse voiced understanding and agreement to the plan.  Tibbie, DO 09/20/19 11:21 AM

## 2019-09-20 NOTE — Patient Instructions (Addendum)
Sorry about the inconvenience.  Keep the diet clean and stay active.  Heat (pad or rice pillow in microwave) over affected area, 10-15 minutes twice daily.   Let us know if you need anything.  EXERCISES  RANGE OF MOTION (ROM) AND STRETCHING EXERCISES - Low Back Pain Most people with lower back pain will find that their symptoms get worse with excessive bending forward (flexion) or arching at the lower back (extension). The exercises that will help resolve your symptoms will focus on the opposite motion.  If you have pain, numbness or tingling which travels down into your buttocks, leg or foot, the goal of the therapy is for these symptoms to move closer to your back and eventually resolve. Sometimes, these leg symptoms will get better, but your lower back pain may worsen. This is often an indication of progress in your rehabilitation. Be very alert to any changes in your symptoms and the activities in which you participated in the 24 hours prior to the change. Sharing this information with your caregiver will allow him or her to most efficiently treat your condition. These exercises may help you when beginning to rehabilitate your injury. Your symptoms may resolve with or without further involvement from your physician, physical therapist or athletic trainer. While completing these exercises, remember:   Restoring tissue flexibility helps normal motion to return to the joints. This allows healthier, less painful movement and activity.  An effective stretch should be held for at least 30 seconds.  A stretch should never be painful. You should only feel a gentle lengthening or release in the stretched tissue. FLEXION RANGE OF MOTION AND STRETCHING EXERCISES:  STRETCH - Flexion, Single Knee to Chest   Lie on a firm bed or floor with both legs extended in front of you.  Keeping one leg in contact with the floor, bring your opposite knee to your chest. Hold your leg in place by either grabbing  behind your thigh or at your knee.  Pull until you feel a gentle stretch in your low back. Hold 30 seconds.  Slowly release your grasp and repeat the exercise with the opposite side. Repeat 2 times. Complete this exercise 3 times per week.   STRETCH - Flexion, Double Knee to Chest  Lie on a firm bed or floor with both legs extended in front of you.  Keeping one leg in contact with the floor, bring your opposite knee to your chest.  Tense your stomach muscles to support your back and then lift your other knee to your chest. Hold your legs in place by either grabbing behind your thighs or at your knees.  Pull both knees toward your chest until you feel a gentle stretch in your low back. Hold 30 seconds.  Tense your stomach muscles and slowly return one leg at a time to the floor. Repeat 2 times. Complete this exercise 3 times per week.   STRETCH - Low Trunk Rotation  Lie on a firm bed or floor. Keeping your legs in front of you, bend your knees so they are both pointed toward the ceiling and your feet are flat on the floor.  Extend your arms out to the side. This will stabilize your upper body by keeping your shoulders in contact with the floor.  Gently and slowly drop both knees together to one side until you feel a gentle stretch in your low back. Hold for 30 seconds.  Tense your stomach muscles to support your lower back as you bring your  knees back to the starting position. Repeat the exercise to the other side. Repeat 2 times. Complete this exercise at least 3 times per week.   EXTENSION RANGE OF MOTION AND FLEXIBILITY EXERCISES:  STRETCH - Extension, Prone on Elbows   Lie on your stomach on the floor, a bed will be too soft. Place your palms about shoulder width apart and at the height of your head.  Place your elbows under your shoulders. If this is too painful, stack pillows under your chest.  Allow your body to relax so that your hips drop lower and make contact more  completely with the floor.  Hold this position for 30 seconds.  Slowly return to lying flat on the floor. Repeat 2 times. Complete this exercise 3 times per week.   RANGE OF MOTION - Extension, Prone Press Ups  Lie on your stomach on the floor, a bed will be too soft. Place your palms about shoulder width apart and at the height of your head.  Keeping your back as relaxed as possible, slowly straighten your elbows while keeping your hips on the floor. You may adjust the placement of your hands to maximize your comfort. As you gain motion, your hands will come more underneath your shoulders.  Hold this position 30 seconds.  Slowly return to lying flat on the floor. Repeat 2 times. Complete this exercise 3 times per week.   RANGE OF MOTION- Quadruped, Neutral Spine   Assume a hands and knees position on a firm surface. Keep your hands under your shoulders and your knees under your hips. You may place padding under your knees for comfort.  Drop your head and point your tailbone toward the ground below you. This will round out your lower back like an angry cat. Hold this position for 30 seconds.  Slowly lift your head and release your tail bone so that your back sags into a large arch, like an old horse.  Hold this position for 30 seconds.  Repeat this until you feel limber in your low back.  Now, find your "sweet spot." This will be the most comfortable position somewhere between the two previous positions. This is your neutral spine. Once you have found this position, tense your stomach muscles to support your low back.  Hold this position for 30 seconds. Repeat 2 times. Complete this exercise 3 times per week.   STRENGTHENING EXERCISES - Low Back Sprain These exercises may help you when beginning to rehabilitate your injury. These exercises should be done near your "sweet spot." This is the neutral, low-back arch, somewhere between fully rounded and fully arched, that is your least  painful position. When performed in this safe range of motion, these exercises can be used for people who have either a flexion or extension based injury. These exercises may resolve your symptoms with or without further involvement from your physician, physical therapist or athletic trainer. While completing these exercises, remember:   Muscles can gain both the endurance and the strength needed for everyday activities through controlled exercises.  Complete these exercises as instructed by your physician, physical therapist or athletic trainer. Increase the resistance and repetitions only as guided.  You may experience muscle soreness or fatigue, but the pain or discomfort you are trying to eliminate should never worsen during these exercises. If this pain does worsen, stop and make certain you are following the directions exactly. If the pain is still present after adjustments, discontinue the exercise until you can discuss the trouble  with your caregiver.  STRENGTHENING - Deep Abdominals, Pelvic Tilt   Lie on a firm bed or floor. Keeping your legs in front of you, bend your knees so they are both pointed toward the ceiling and your feet are flat on the floor.  Tense your lower abdominal muscles to press your low back into the floor. This motion will rotate your pelvis so that your tail bone is scooping upwards rather than pointing at your feet or into the floor. With a gentle tension and even breathing, hold this position for 3 seconds. Repeat 2 times. Complete this exercise 3 times per week.   STRENGTHENING - Abdominals, Crunches   Lie on a firm bed or floor. Keeping your legs in front of you, bend your knees so they are both pointed toward the ceiling and your feet are flat on the floor. Cross your arms over your chest.  Slightly tip your chin down without bending your neck.  Tense your abdominals and slowly lift your trunk high enough to just clear your shoulder blades. Lifting higher  can put excessive stress on the lower back and does not further strengthen your abdominal muscles.  Control your return to the starting position. Repeat 2 times. Complete this exercise 3 times per week.   STRENGTHENING - Quadruped, Opposite UE/LE Lift   Assume a hands and knees position on a firm surface. Keep your hands under your shoulders and your knees under your hips. You may place padding under your knees for comfort.  Find your neutral spine and gently tense your abdominal muscles so that you can maintain this position. Your shoulders and hips should form a rectangle that is parallel with the floor and is not twisted.  Keeping your trunk steady, lift your right hand no higher than your shoulder and then your left leg no higher than your hip. Make sure you are not holding your breath. Hold this position for 30 seconds.  Continuing to keep your abdominal muscles tense and your back steady, slowly return to your starting position. Repeat with the opposite arm and leg. Repeat 2 times. Complete this exercise 3 times per week.   STRENGTHENING - Abdominals and Quadriceps, Straight Leg Raise   Lie on a firm bed or floor with both legs extended in front of you.  Keeping one leg in contact with the floor, bend the other knee so that your foot can rest flat on the floor.  Find your neutral spine, and tense your abdominal muscles to maintain your spinal position throughout the exercise.  Slowly lift your straight leg off the floor about 6 inches for a count of 3, making sure to not hold your breath.  Still keeping your neutral spine, slowly lower your leg all the way to the floor. Repeat this exercise with each leg 2 times. Complete this exercise 3 times per week.  POSTURE AND BODY MECHANICS CONSIDERATIONS - Low Back Sprain Keeping correct posture when sitting, standing or completing your activities will reduce the stress put on different body tissues, allowing injured tissues a chance to  heal and limiting painful experiences. The following are general guidelines for improved posture.  While reading these guidelines, remember:  The exercises prescribed by your provider will help you have the flexibility and strength to maintain correct postures.  The correct posture provides the best environment for your joints to work. All of your joints have less wear and tear when properly supported by a spine with good posture. This means you will experience  a healthier, less painful body.  Correct posture must be practiced with all of your activities, especially prolonged sitting and standing. Correct posture is as important when doing repetitive low-stress activities (typing) as it is when doing a single heavy-load activity (lifting).  RESTING POSITIONS Consider which positions are most painful for you when choosing a resting position. If you have pain with flexion-based activities (sitting, bending, stooping, squatting), choose a position that allows you to rest in a less flexed posture. You would want to avoid curling into a fetal position on your side. If your pain worsens with extension-based activities (prolonged standing, working overhead), avoid resting in an extended position such as sleeping on your stomach. Most people will find more comfort when they rest with their spine in a more neutral position, neither too rounded nor too arched. Lying on a non-sagging bed on your side with a pillow between your knees, or on your back with a pillow under your knees will often provide some relief. Keep in mind, being in any one position for a prolonged period of time, no matter how correct your posture, can still lead to stiffness.  PROPER SITTING POSTURE In order to minimize stress and discomfort on your spine, you must sit with correct posture. Sitting with good posture should be effortless for a healthy body. Returning to good posture is a gradual process. Many people can work toward this most  comfortably by using various supports until they have the flexibility and strength to maintain this posture on their own. When sitting with proper posture, your ears will fall over your shoulders and your shoulders will fall over your hips. You should use the back of the chair to support your upper back. Your lower back will be in a neutral position, just slightly arched. You may place a small pillow or folded towel at the base of your lower back for  support.  When working at a desk, create an environment that supports good, upright posture. Without extra support, muscles tire, which leads to excessive strain on joints and other tissues. Keep these recommendations in mind:  CHAIR:  A chair should be able to slide under your desk when your back makes contact with the back of the chair. This allows you to work closely.  The chair's height should allow your eyes to be level with the upper part of your monitor and your hands to be slightly lower than your elbows.  BODY POSITION  Your feet should make contact with the floor. If this is not possible, use a foot rest.  Keep your ears over your shoulders. This will reduce stress on your neck and low back.  INCORRECT SITTING POSTURES  If you are feeling tired and unable to assume a healthy sitting posture, do not slouch or slump. This puts excessive strain on your back tissues, causing more damage and pain. Healthier options include:  Using more support, like a lumbar pillow.  Switching tasks to something that requires you to be upright or walking.  Talking a brief walk.  Lying down to rest in a neutral-spine position.  PROLONGED STANDING WHILE SLIGHTLY LEANING FORWARD  When completing a task that requires you to lean forward while standing in one place for a long time, place either foot up on a stationary 2-4 inch high object to help maintain the best posture. When both feet are on the ground, the lower back tends to lose its slight inward  curve. If this curve flattens (or becomes too large), then  the back and your other joints will experience too much stress, tire more quickly, and can cause pain.  CORRECT STANDING POSTURES Proper standing posture should be assumed with all daily activities, even if they only take a few moments, like when brushing your teeth. As in sitting, your ears should fall over your shoulders and your shoulders should fall over your hips. You should keep a slight tension in your abdominal muscles to brace your spine. Your tailbone should point down to the ground, not behind your body, resulting in an over-extended swayback posture.   INCORRECT STANDING POSTURES  Common incorrect standing postures include a forward head, locked knees and/or an excessive swayback. WALKING Walk with an upright posture. Your ears, shoulders and hips should all line-up.  PROLONGED ACTIVITY IN A FLEXED POSITION When completing a task that requires you to bend forward at your waist or lean over a low surface, try to find a way to stabilize 3 out of 4 of your limbs. You can place a hand or elbow on your thigh or rest a knee on the surface you are reaching across. This will provide you more stability, so that your muscles do not tire as quickly. By keeping your knees relaxed, or slightly bent, you will also reduce stress across your lower back. CORRECT LIFTING TECHNIQUES  DO :  Assume a wide stance. This will provide you more stability and the opportunity to get as close as possible to the object which you are lifting.  Tense your abdominals to brace your spine. Bend at the knees and hips. Keeping your back locked in a neutral-spine position, lift using your leg muscles. Lift with your legs, keeping your back straight.  Test the weight of unknown objects before attempting to lift them.  Try to keep your elbows locked down at your sides in order get the best strength from your shoulders when carrying an object.     Always ask  for help when lifting heavy or awkward objects. INCORRECT LIFTING TECHNIQUES DO NOT:   Lock your knees when lifting, even if it is a small object.  Bend and twist. Pivot at your feet or move your feet when needing to change directions.  Assume that you can safely pick up even a paperclip without proper posture.

## 2019-09-22 ENCOUNTER — Other Ambulatory Visit (INDEPENDENT_AMBULATORY_CARE_PROVIDER_SITE_OTHER): Payer: Medicare Other

## 2019-09-22 ENCOUNTER — Other Ambulatory Visit: Payer: Self-pay

## 2019-09-22 DIAGNOSIS — E1165 Type 2 diabetes mellitus with hyperglycemia: Secondary | ICD-10-CM

## 2019-09-22 LAB — COMPREHENSIVE METABOLIC PANEL
ALT: 18 U/L (ref 0–53)
AST: 17 U/L (ref 0–37)
Albumin: 3.7 g/dL (ref 3.5–5.2)
Alkaline Phosphatase: 71 U/L (ref 39–117)
BUN: 13 mg/dL (ref 6–23)
CO2: 29 mEq/L (ref 19–32)
Calcium: 8.7 mg/dL (ref 8.4–10.5)
Chloride: 106 mEq/L (ref 96–112)
Creatinine, Ser: 0.9 mg/dL (ref 0.40–1.50)
GFR: 82.05 mL/min (ref >60.00)
Glucose, Bld: 111 mg/dL — ABNORMAL HIGH (ref 70–99)
Potassium: 3.6 mEq/L (ref 3.5–5.1)
Sodium: 140 mEq/L (ref 135–145)
Total Bilirubin: 0.8 mg/dL (ref 0.2–1.2)
Total Protein: 6.5 g/dL (ref 6.0–8.3)

## 2019-09-22 LAB — LIPID PANEL
Cholesterol: 88 mg/dL (ref 0–200)
HDL: 31.6 mg/dL — ABNORMAL LOW (ref >39.00)
LDL Cholesterol: 35 mg/dL (ref 0–99)
NonHDL: 56.88
Total CHOL/HDL Ratio: 3
Triglycerides: 110 mg/dL (ref 0.0–149.0)
VLDL: 22 mg/dL (ref 0.0–40.0)

## 2019-09-26 ENCOUNTER — Other Ambulatory Visit: Payer: Self-pay | Admitting: Family Medicine

## 2019-09-26 MED FILL — ATORVASTATIN 80 MG TABLET: 80 | 90 days supply | Qty: 90 | Fill #1

## 2019-09-26 MED FILL — MIRTAZAPINE 30 MG TABLET: 30 | 90 days supply | Qty: 90 | Fill #0

## 2019-10-03 ENCOUNTER — Other Ambulatory Visit: Payer: Self-pay | Admitting: Family Medicine

## 2019-10-03 MED FILL — METFORMIN HCL 500 MG TABS: 500 | 90 days supply | Qty: 180 | Fill #0

## 2019-10-24 ENCOUNTER — Other Ambulatory Visit: Payer: Self-pay | Admitting: Family Medicine

## 2019-10-24 ENCOUNTER — Other Ambulatory Visit: Payer: Self-pay | Admitting: Cardiology

## 2019-10-24 DIAGNOSIS — R413 Other amnesia: Secondary | ICD-10-CM

## 2019-10-24 MED FILL — AMLODIPINE BESYLATE 5 MG TA: 5 | 90 days supply | Qty: 90 | Fill #2

## 2019-10-24 MED FILL — METOPROLOL SUCCINATE ER 50: 50 | 90 days supply | Qty: 180 | Fill #0

## 2019-10-24 MED FILL — CLOPIDOGREL 75 MG TABLET: 75 | 90 days supply | Qty: 90 | Fill #1

## 2019-10-24 MED FILL — DONEPEZIL HCL 5 MG TABLET: 5 | 90 days supply | Qty: 90 | Fill #0

## 2019-10-24 NOTE — Telephone Encounter (Signed)
Rx refill sent to pharmacy. 

## 2019-11-25 ENCOUNTER — Ambulatory Visit: Payer: Medicare Other | Attending: Internal Medicine

## 2019-11-25 ENCOUNTER — Other Ambulatory Visit (HOSPITAL_BASED_OUTPATIENT_CLINIC_OR_DEPARTMENT_OTHER): Payer: Self-pay | Admitting: Internal Medicine

## 2019-11-25 DIAGNOSIS — Z23 Encounter for immunization: Secondary | ICD-10-CM

## 2019-11-25 NOTE — Progress Notes (Signed)
   Cov[facility] Vaccination [facility]  Name:  Bryan Henderson    MRN: [id number] DOB: [birth year]  11/25/2019  [name] was observed post Covid-19 immunization for 15 minutes without incident. He was provided with Vaccine Information Sheet and instruction to access the V-Safe system. Vaccinated By: Sabra Heck  [name] was instructed to call 911 with any severe reactions post vaccine: Marland Kitchen Difficulty breathing  . Swelling of face and throat  . A fast heartbeat  . A bad rash all over body  . Dizziness and weakness

## 2019-12-13 ENCOUNTER — Other Ambulatory Visit (HOSPITAL_BASED_OUTPATIENT_CLINIC_OR_DEPARTMENT_OTHER): Payer: Self-pay | Admitting: Internal Medicine

## 2019-12-13 MED FILL — FLUAD QUADRIVALENT 0.5 ML P: 0.5 | 1 days supply | Qty: 1 | Fill #0

## 2020-01-06 ENCOUNTER — Other Ambulatory Visit: Payer: Self-pay | Admitting: Family Medicine

## 2020-01-06 ENCOUNTER — Other Ambulatory Visit: Payer: Self-pay | Admitting: *Deleted

## 2020-01-06 ENCOUNTER — Other Ambulatory Visit: Payer: Self-pay | Admitting: Cardiology

## 2020-01-06 MED ORDER — PANTOPRAZOLE SODIUM 40 MG PO TBEC
40.0000 mg | DELAYED_RELEASE_TABLET | Freq: Every day | ORAL | 0 refills | Status: DC
Start: 2020-01-06 — End: 2020-02-23

## 2020-01-06 MED FILL — MIRTAZAPINE 30 MG TABLET: 30 | 90 days supply | Qty: 90 | Fill #1

## 2020-01-06 MED FILL — ATORVASTATIN 80 MG TABLET: 80 | 90 days supply | Qty: 90 | Fill #0

## 2020-01-06 MED FILL — PANTOPRAZOLE SOD DR 40 MG T: 40 | 90 days supply | Qty: 90 | Fill #0

## 2020-01-06 MED FILL — METFORMIN HCL 500 MG TABS: 500 | 90 days supply | Qty: 180 | Fill #1

## 2020-01-06 NOTE — Telephone Encounter (Signed)
Cardiologist d/c back in June (08/15/19).  Do you want to restart.

## 2020-01-06 NOTE — Telephone Encounter (Signed)
She stated that he never stopped taking.  He can and will stop since he is having no stomach problems now if you want.  Please advised?

## 2020-01-06 NOTE — Telephone Encounter (Signed)
Please disregard patient will continue to take.  Refill sent in.

## 2020-01-06 NOTE — Telephone Encounter (Signed)
Patient would like a refill on pantoprazole (PROTONIX) 40 MG tablet [Pharmacy Med Name: PANTOPRAZOLE SOD DR 40 MG T 40 Tablet] [[id number]]   [place]  [place], [place] [address]  Phone:  [phone] Fax:  [phone]

## 2020-01-06 NOTE — Telephone Encounter (Signed)
Can we please verify with Bonnita Nasuti, his wife, that he is indeed taking this? If so, OK to fill. Ty.

## 2020-01-31 ENCOUNTER — Other Ambulatory Visit: Payer: Self-pay | Admitting: Cardiology

## 2020-01-31 MED FILL — CLOPIDOGREL 75 MG TABLET: 75 | 30 days supply | Qty: 30 | Fill #0

## 2020-01-31 MED FILL — AMLODIPINE BESYLATE 5 MG TA: 5 | 90 days supply | Qty: 90 | Fill #3

## 2020-01-31 MED FILL — METOPROLOL SUCCINATE ER 50: 50 | 90 days supply | Qty: 180 | Fill #1

## 2020-01-31 MED FILL — DONEPEZIL HCL 5 MG TABLET: 5 | 90 days supply | Qty: 90 | Fill #1

## 2020-01-31 NOTE — Telephone Encounter (Signed)
Rx refill sent to pharmacy. 

## 2020-02-23 ENCOUNTER — Other Ambulatory Visit: Payer: Self-pay | Admitting: Family Medicine

## 2020-02-23 ENCOUNTER — Other Ambulatory Visit: Payer: Self-pay | Admitting: Cardiology

## 2020-02-23 MED FILL — CLOPIDOGREL 75 MG TABLET: 75 | 30 days supply | Qty: 30 | Fill #0

## 2020-02-23 NOTE — Telephone Encounter (Signed)
Refill sent to pharmacy.   Metoprolol 50 mg Amlodipine 5 mg Plavix 75 mg

## 2020-03-02 ENCOUNTER — Other Ambulatory Visit: Payer: Self-pay

## 2020-03-02 MED ORDER — CLOPIDOGREL BISULFATE 75 MG PO TABS
ORAL_TABLET | ORAL | 3 refills | Status: DC
Start: 2020-03-02 — End: 2020-03-02

## 2020-03-16 ENCOUNTER — Other Ambulatory Visit: Payer: Medicare Other

## 2020-03-16 ENCOUNTER — Other Ambulatory Visit: Payer: Self-pay

## 2020-03-19 ENCOUNTER — Other Ambulatory Visit: Payer: Self-pay

## 2020-03-19 ENCOUNTER — Other Ambulatory Visit (INDEPENDENT_AMBULATORY_CARE_PROVIDER_SITE_OTHER): Payer: Medicare Other

## 2020-03-19 DIAGNOSIS — E1165 Type 2 diabetes mellitus with hyperglycemia: Secondary | ICD-10-CM

## 2020-03-19 DIAGNOSIS — I10 Essential (primary) hypertension: Secondary | ICD-10-CM

## 2020-03-19 LAB — HEMOGLOBIN A1C: Hgb A1c MFr Bld: 6.5 % (ref 4.6–6.5)

## 2020-03-19 LAB — CBC
HCT: 47.3 % (ref 39.0–52.0)
Hemoglobin: 15.7 g/dL (ref 13.0–17.0)
MCHC: 33.3 g/dL (ref 30.0–36.0)
MCV: 92.8 fl (ref 78.0–100.0)
Platelets: 262 10*3/uL (ref 150.0–400.0)
RBC: 5.1 Mil/uL (ref 4.22–5.81)
RDW: 14.3 % (ref 11.5–15.5)
WBC: 7.1 10*3/uL (ref 4.0–10.5)

## 2020-03-19 LAB — COMPREHENSIVE METABOLIC PANEL
ALT: 24 U/L (ref 0–53)
AST: 19 U/L (ref 0–37)
Albumin: 4.2 g/dL (ref 3.5–5.2)
Alkaline Phosphatase: 71 U/L (ref 39–117)
BUN: 14 mg/dL (ref 6–23)
CO2: 30 mEq/L (ref 19–32)
Calcium: 9.2 mg/dL (ref 8.4–10.5)
Chloride: 104 mEq/L (ref 96–112)
Creatinine, Ser: 1 mg/dL (ref 0.40–1.50)
GFR: 73.13 mL/min (ref >60.00)
Glucose, Bld: 116 mg/dL — ABNORMAL HIGH (ref 70–99)
Potassium: 4.4 mEq/L (ref 3.5–5.1)
Sodium: 139 mEq/L (ref 135–145)
Total Bilirubin: 1.2 mg/dL (ref 0.2–1.2)
Total Protein: 6.9 g/dL (ref 6.0–8.3)

## 2020-03-19 LAB — LIPID PANEL
Cholesterol: 95 mg/dL (ref 0–200)
HDL: 38.4 mg/dL — ABNORMAL LOW (ref >39.00)
LDL Cholesterol: 34 mg/dL (ref 0–99)
NonHDL: 56.82
Total CHOL/HDL Ratio: 2
Triglycerides: 115 mg/dL (ref 0.0–149.0)
VLDL: 23 mg/dL (ref 0.0–40.0)

## 2020-03-19 LAB — MICROALBUMIN / CREATININE URINE RATIO
Creatinine,U: 119.9 mg/dL
Microalb Creat Ratio: 0.6 mg/g (ref 0.0–30.0)
Microalb, Ur: <0.7 mg/dL (ref 0.0–1.9)

## 2020-03-23 ENCOUNTER — Ambulatory Visit (INDEPENDENT_AMBULATORY_CARE_PROVIDER_SITE_OTHER): Payer: Medicare Other | Admitting: Family Medicine

## 2020-03-23 ENCOUNTER — Other Ambulatory Visit: Payer: Self-pay | Admitting: Family Medicine

## 2020-03-23 ENCOUNTER — Other Ambulatory Visit: Payer: Self-pay

## 2020-03-23 ENCOUNTER — Encounter: Payer: Self-pay | Admitting: Family Medicine

## 2020-03-23 VITALS — BP 108/60 | HR 74 | Resp 15 | Ht 66.0 in | Wt 151.0 lb

## 2020-03-23 DIAGNOSIS — E1165 Type 2 diabetes mellitus with hyperglycemia: Secondary | ICD-10-CM

## 2020-03-23 DIAGNOSIS — Z0000 Encounter for general adult medical examination without abnormal findings: Secondary | ICD-10-CM | POA: Diagnosis not present

## 2020-03-23 MED FILL — CLOPIDOGREL 75 MG TABLET: 75 | 90 days supply | Qty: 90 | Fill #0

## 2020-03-23 NOTE — Progress Notes (Signed)
Chief Complaint  Patient presents with  . Annual Exam    Yearly exam, follow up, discuss lab work     Well Male Bryan Henderson is here for a complete physical. Here w his spouse who helps interpret.   His last physical was >1 year ago.  Current diet: in general, a "healthy" diet.   Current exercise: walking Weight trend: stable Fatigue out of ordinary? No. Seat belt? Yes.    Health maintenance Shingrix- Yes Tetanus- Yes Hep C- Yes Pneumonia vaccine- Yes AAA screening- Yes  Past Medical History:  Diagnosis Date  . Coronary artery disease   . Depression   . Diabetes mellitus without complication ([place])   . Hypertension   . Stroke [facility])      Past Surgical History:  Procedure Laterality Date  . CORONARY STENT INTERVENTION N/A 03/23/2018   Procedure: CORONARY STENT INTERVENTION;  Surgeon: Jettie Booze, MD;  Location: [facility];  Service: Cardiovascular;  Laterality: N/A;  . LEFT HEART CATH AND CORONARY ANGIOGRAPHY N/A 03/23/2018   Procedure: LEFT HEART CATH AND CORONARY ANGIOGRAPHY;  Surgeon: Jettie Booze, MD;  Location: [facility];  Service: Cardiovascular;  Laterality: N/A;    Medications  Current Outpatient Medications on File Prior to Visit  Medication Sig Dispense Refill  . amLODipine (NORVASC) 5 MG tablet TAKE 1 TABLET (5 MG TOTAL) BY MOUTH DAILY. 90 tablet 3  . atorvastatin (LIPITOR) 80 MG tablet TAKE 1 TABLET BY MOUTH DAILY AT 6 PM. 90 tablet 1  . b complex vitamins capsule Take 1 capsule by mouth daily.    . clopidogrel (PLAVIX) 75 MG tablet Take one tablet by mouth daily. 90 tablet 3  . donepezil (ARICEPT) 5 MG tablet TAKE 1 TABLET (5 MG TOTAL) BY MOUTH AT BEDTIME. 90 tablet 2  . metFORMIN (GLUCOPHAGE) 500 MG tablet TAKE 1 TABLET BY MOUTH TWICE DAILY WITH A MEAL 180 tablet 1  . metoprolol succinate (TOPROL-XL) 50 MG 24 hr tablet TAKE 1 TABLET (50 MG TOTAL) BY MOUTH 2 (TWO) TIMES DAILY. TAKE WITH OR IMMEDIATELY FOLLOWING A MEAL. 180  tablet 1  . mirtazapine (REMERON) 30 MG tablet TAKE 1 TABLET BY MOUTH AT BEDTIME 90 tablet 1  . Multiple Vitamin (MULTIVITAMIN) tablet Take 1 tablet by mouth daily.    . nitroGLYCERIN (NITROSTAT) 0.4 MG SL tablet Place 1 tablet (0.4 mg total) under the tongue every 5 (five) minutes as needed for chest pain. 25 tablet 0  . Omega-3 Fatty Acids (FISH OIL) 1000 MG CAPS Take 1 capsule by mouth daily.    . pantoprazole (PROTONIX) 40 MG tablet TAKE 1 TABLET BY MOUTH ONCE DAILY 90 tablet 0  . vitamin C (ASCORBIC ACID) 500 MG tablet Take 500 mg by mouth daily.     Allergies No Known Allergies  Family History Family History  Problem Relation Age of Onset  . Hypertension Brother   . Heart attack Neg Hx   . Heart disease Neg Hx     Review of Systems: Constitutional:  no fevers Eye:  no recent significant change in vision Ears:  No changes in hearing Nose/Mouth/Throat:  no complaints of nasal congestion, no sore throat Cardiovascular: no chest pain Respiratory:  No shortness of breath Gastrointestinal:  No change in bowel habits GU:  No frequency Integumentary:  no abnormal skin lesions reported Neurologic:  no headaches Endocrine:  denies unexplained weight changes  Exam BP 108/60 (BP Location: Right Arm, Patient Position: Sitting, Cuff Size: Normal)  Pulse 74   Resp 15   Ht 5\' 6"  (1.676 m)   Wt 151 lb (68.5 kg)   SpO2 98%   BMI 24.37 kg/m  General:  well developed, well nourished, in no apparent distress Skin:  no significant moles, warts, or growths Head:  no masses, lesions, or tenderness Eyes:  pupils equal and round, sclera anicteric without injection Ears:  canals without lesions, TMs shiny without retraction, no obvious effusion, no erythema Nose:  nares patent, septum midline, mucosa normal Throat/Pharynx:  lips and gingiva without lesion; tongue and uvula midline; non-inflamed pharynx; no exudates or postnasal drainage Lungs:  clear to auscultation, breath sounds equal  bilaterally, no respiratory distress Cardio:  regular rate and rhythm, no LE edema or bruits Rectal: Deferred GI: BS+, S, NT, ND, no masses or organomegaly Musculoskeletal:  symmetrical muscle groups noted without atrophy or deformity Neuro:  gait normal; deep tendon reflexes normal and symmetric Psych: well oriented with normal range of affect and appropriate judgment/insight  Assessment and Plan  Well adult exam  Type 2 diabetes mellitus with hyperglycemia, without long-term current use of insulin (HCC) - Plan: Comprehensive metabolic panel, CBC, Hemoglobin A1c, Lipid panel   Well 75 y.o. male. Counseled on diet and exercise. Other orders as above. Follow up in 6 mo.  The patient and his spouse voiced understanding and agreement to the plan.  [place], DO 03/23/20 10:55 AM

## 2020-03-23 NOTE — Patient Instructions (Addendum)
Keep the diet clean and stay active.  We can stop the multivitamin/supplements if you want. They are not harmful though.   Read and do a puzzle/game for 60 minutes daily.   Let us know if you need anything.

## 2020-04-02 MED FILL — ATORVASTATIN 80 MG TABLET: 80 | 90 days supply | Qty: 90 | Fill #1

## 2020-04-02 MED FILL — MIRTAZAPINE 30 MG TABLET: 30 | 90 days supply | Qty: 90 | Fill #0

## 2020-04-04 DIAGNOSIS — H40023 Open angle with borderline findings, high risk, bilateral: Secondary | ICD-10-CM | POA: Diagnosis not present

## 2020-04-04 DIAGNOSIS — H524 Presbyopia: Secondary | ICD-10-CM | POA: Diagnosis not present

## 2020-04-04 DIAGNOSIS — E119 Type 2 diabetes mellitus without complications: Secondary | ICD-10-CM | POA: Diagnosis not present

## 2020-04-20 ENCOUNTER — Other Ambulatory Visit: Payer: Self-pay | Admitting: Family Medicine

## 2020-04-20 DIAGNOSIS — I2510 Atherosclerotic heart disease of native coronary artery without angina pectoris: Secondary | ICD-10-CM | POA: Insufficient documentation

## 2020-04-20 DIAGNOSIS — I639 Cerebral infarction, unspecified: Secondary | ICD-10-CM | POA: Insufficient documentation

## 2020-04-20 DIAGNOSIS — I10 Essential (primary) hypertension: Secondary | ICD-10-CM | POA: Insufficient documentation

## 2020-04-20 DIAGNOSIS — E119 Type 2 diabetes mellitus without complications: Secondary | ICD-10-CM | POA: Insufficient documentation

## 2020-04-20 MED FILL — PANTOPRAZOLE SOD DR 40 MG T: 40 | 90 days supply | Qty: 90 | Fill #0

## 2020-04-20 MED FILL — METFORMIN HCL 500 MG TABS: 500 | 90 days supply | Qty: 180 | Fill #0

## 2020-04-27 ENCOUNTER — Ambulatory Visit: Payer: Medicare Other | Admitting: Cardiology

## 2020-05-11 DIAGNOSIS — H40023 Open angle with borderline findings, high risk, bilateral: Secondary | ICD-10-CM | POA: Diagnosis not present

## 2020-05-11 LAB — HM DIABETES EYE EXAM

## 2020-05-15 ENCOUNTER — Ambulatory Visit: Payer: Medicare Other | Admitting: Cardiology

## 2020-05-22 NOTE — Progress Notes (Deleted)
Cardiology Office Note:    Date:  05/22/2020   ID:  Bryan Henderson, DOB 02-07-1946, MRN [id number]  PCP:  Shelda Pal, DO  Cardiologist:  Shirlee More, MD    Referring MD: Shelda Pal*    ASSESSMENT:    No diagnosis found. PLAN:    In order of problems listed above:  1. ***   Next appointment: ***   Medication Adjustments/Labs and Tests Ordered: Current medicines are reviewed at length with the patient today.  Concerns regarding medicines are outlined above.  No orders of the defined types were placed in this encounter.  No orders of the defined types were placed in this encounter.   No chief complaint on file.   History of Present Illness:    Bryan Henderson is a 75 y.o. male with a hx of CAD non-ST elevation MI 03/22/2018 with PCI and stent left circumflex ejection fraction 60% hypertension and hyper lipidemia.  He was last seen 08/15/2019. Compliance with diet, lifestyle and medications: *** Past Medical History:  Diagnosis Date  . Coronary artery disease   . Depression   . Diabetes mellitus without complication ([place])   . Hypertension   . Stroke [facility])     Past Surgical History:  Procedure Laterality Date  . CORONARY STENT INTERVENTION N/A 03/23/2018   Procedure: CORONARY STENT INTERVENTION;  Surgeon: Jettie Booze, MD;  Location: [place] [facility];  Service: Cardiovascular;  Laterality: N/A;  . LEFT HEART CATH AND CORONARY ANGIOGRAPHY N/A 03/23/2018   Procedure: LEFT HEART CATH AND CORONARY ANGIOGRAPHY;  Surgeon: Jettie Booze, MD;  Location: [place] [facility];  Service: Cardiovascular;  Laterality: N/A;    Current Medications: No outpatient medications have been marked as taking for the 05/23/20 encounter (Appointment) with Richardo Priest, MD.     Allergies:   Patient has no known allergies.   Social History   Socioeconomic History  . Marital status: Married    Spouse name: Not on file  . Number of children: Not  on file  . Years of education: Not on file  . Highest education level: Not on file  Occupational History  . Not on file  Tobacco Use  . Smoking status: Former Smoker    Types: Cigarettes  . Smokeless tobacco: Never Used  . Tobacco comment: quit in 1986  Vaping Use  . Vaping Use: Never used  Substance and Sexual Activity  . Alcohol use: Not Currently  . Drug use: Never  . Sexual activity: Not on file  Other Topics Concern  . Not on file  Social History Narrative  . Not on file   Social Determinants of Health   Financial Resource Strain: Not on file  Food Insecurity: Not on file  Transportation Needs: Not on file  Physical Activity: Not on file  Stress: Not on file  Social Connections: Not on file     Family History: The patient's ***family history includes Hypertension in his brother. There is no history of Heart attack or Heart disease. ROS:   Please see the history of present illness.    All other systems reviewed and are negative.  EKGs/Labs/Other Studies Reviewed:    The following studies were reviewed today:  EKG:  EKG ordered today and personally reviewed.  The ekg ordered today demonstrates ***  Recent Labs: 03/19/2020: ALT 24; BUN 14; Creatinine, Ser 1.00; Hemoglobin 15.7; Platelets 262.0; Potassium 4.4; Sodium 139  Recent Lipid Panel    Component Value Date/Time   CHOL  95 03/19/2020 0924   TRIG 115.0 03/19/2020 0924   HDL 38.40 (L) 03/19/2020 0924   CHOLHDL 2 03/19/2020 0924   VLDL 23.0 03/19/2020 0924   LDLCALC 34 03/19/2020 0924    Physical Exam:    VS:  There were no vitals taken for this visit.    Wt Readings from Last 3 Encounters:  03/23/20 151 lb (68.5 kg)  09/20/19 146 lb (66.2 kg)  08/15/19 146 lb (66.2 kg)     GEN: *** Well nourished, well developed in no acute distress HEENT: Normal NECK: No JVD; No carotid bruits LYMPHATICS: No lymphadenopathy CARDIAC: ***RRR, no murmurs, rubs, gallops RESPIRATORY:  Clear to auscultation  without rales, wheezing or rhonchi  ABDOMEN: Soft, non-tender, non-distended MUSCULOSKELETAL:  No edema; No deformity  SKIN: Warm and dry NEUROLOGIC:  Alert and oriented x 3 PSYCHIATRIC:  Normal affect    Signed, Shirlee More, MD  05/22/2020 12:44 PM    [facility]

## 2020-05-23 ENCOUNTER — Ambulatory Visit: Payer: Medicare Other | Admitting: Cardiology

## 2020-06-04 NOTE — Progress Notes (Signed)
Cardiology Office Note:    Date:  06/05/2020   ID:  Bryan Henderson, DOB 07/02/1945, MRN [id number]  PCP:  Shelda Pal, DO  Cardiologist:  Shirlee More, MD    Referring MD: Shelda Pal*    ASSESSMENT:    1. Coronary artery disease involving native coronary artery of native heart without angina pectoris   2. Essential hypertension   3. Mixed hyperlipidemia    PLAN:    In order of problems listed above:  1. Stable CAD with history of class I having no angina current therapy including statin clopidogrel beta-blocker calcium channel blocker he had multivessel CAD I think a perfusion study is appropriate here. 2. BP at target repeat 138/56 3. Lipids are ideal continue his high intensity statin   Next appointment: 6 months   Medication Adjustments/Labs and Tests Ordered: Current medicines are reviewed at length with the patient today.  Concerns regarding medicines are outlined above.  Orders Placed This Encounter  Procedures  . MYOCARDIAL PERFUSION IMAGING  . EKG 12-Lead   No orders of the defined types were placed in this encounter.   Chief Complaint  Patient presents with  . Follow-up    History of Present Illness:    Bryan Henderson is a 75 y.o. male with a hx of CAD with non-ST elevation MI 03/22/2018 with PCI and stent to left circumflex coronary artery EF 60% with hypokinesia of the basal and mid anterolateral segments hypertension and hyper lipidemia last seen 08/06/2019.  Compliance with diet, lifestyle and medications: Yes  He continues to do well he exercises daily no edema shortness of breath chest pain palpitation or syncope.  He had multivessel CAD is more than 2 years out from his event and I think in this case perfusion study is appropriate to stratify.  He agrees to schedule as an outpatient.  He tolerates his lipid-lowering therapy without muscle pain or weakness he has had no bleeding with aspirin.  His lipids are ideal  Cardiac  catheterization 03/23/2018:   Mid RCA lesion is 25% stenosed.  Prox RCA lesion is 25% stenosed.  Dist RCA lesion is 25% stenosed.  Post Atrio lesion is 50% stenosed.  Mid Cx lesion is 100% stenosed. This was the culprit lesion.  A drug-eluting stent was successfully placed using a STENT SYNERGY DES 3X24.  Post intervention, there is a 0% residual stenosis.  Mid LAD lesion is 25% stenosed.  Ost 1st Diag to 1st Diag lesion is 25% stenosed.  The left ventricular systolic function is normal.  LV end diastolic pressure is normal.  The left ventricular ejection fraction is 55-65% by visual estimate.  There is no aortic valve stenosis.  EBU 3.75 Guide would have been preferable.  Continue aggressive secondary prevention. He will be watched overnight.  _____________ Echocardiogram 03/23/2018:  FINDINGS Left Ventricle: The left ventricle appears to be normal in size, has mild wall thickness normal systolic function with a 40-98% ejection fraction Spectral Doppler shows indeterminate pattern of diastolic filling. There is moderate hypokinesis of the  basiliar-mid anterolateral left ventricular segment. Septal left ventricular hypertrophy. Right Ventricle: The right ventricle is normal in size mildly increased wall thickness has normal systolic function. Left Atrium: The left atrium is normal in size. Right Atrium: The right atrial size is normal in size. Interatrial Septum: No atrial level shunt detected by color flow Doppler Past Medical History:  Diagnosis Date  . Coronary artery disease   . Depression   . Diabetes mellitus without complication ([place])   .  Hypertension   . Stroke [facility])     Past Surgical History:  Procedure Laterality Date  . CORONARY STENT INTERVENTION N/A 03/23/2018   Procedure: CORONARY STENT INTERVENTION;  Surgeon: Jettie Booze, MD;  Location: [place] [facility];  Service: Cardiovascular;  Laterality: N/A;  . LEFT HEART CATH AND CORONARY  ANGIOGRAPHY N/A 03/23/2018   Procedure: LEFT HEART CATH AND CORONARY ANGIOGRAPHY;  Surgeon: Jettie Booze, MD;  Location: [place] [facility];  Service: Cardiovascular;  Laterality: N/A;    Current Medications: Current Meds  Medication Sig  . amLODipine (NORVASC) 5 MG tablet TAKE 1 TABLET (5 MG TOTAL) BY MOUTH DAILY.  Marland Kitchen atorvastatin (LIPITOR) 80 MG tablet TAKE 1 TABLET BY MOUTH DAILY AT 6 PM.  . b complex vitamins capsule Take 1 capsule by mouth daily.  . clopidogrel (PLAVIX) 75 MG tablet TAKE 1 TABLET BY MOUTH ONCE DAILY  . donepezil (ARICEPT) 5 MG tablet TAKE 1 TABLET (5 MG TOTAL) BY MOUTH AT BEDTIME.  . metFORMIN (GLUCOPHAGE) 500 MG tablet TAKE 1 TABLET BY MOUTH TWICE DAILY WITH A MEAL  . metoprolol succinate (TOPROL-XL) 50 MG 24 hr tablet TAKE 1 TABLET (50 MG TOTAL) BY MOUTH 2 (TWO) TIMES DAILY. TAKE WITH OR IMMEDIATELY FOLLOWING A MEAL.  . mirtazapine (REMERON) 30 MG tablet TAKE 1 TABLET BY MOUTH EVERY NIGHT AT BEDTIME  . Multiple Vitamin (MULTIVITAMIN) tablet Take 1 tablet by mouth daily.  . nitroGLYCERIN (NITROSTAT) 0.4 MG SL tablet Place 1 tablet (0.4 mg total) under the tongue every 5 (five) minutes as needed for chest pain.  . Omega-3 Fatty Acids (FISH OIL) 1000 MG CAPS Take 1 capsule by mouth daily.  . pantoprazole (PROTONIX) 40 MG tablet TAKE 1 TABLET BY MOUTH ONCE DAILY  . vitamin C (ASCORBIC ACID) 500 MG tablet Take 500 mg by mouth daily.     Allergies:   Patient has no known allergies.   Social History   Socioeconomic History  . Marital status: Married    Spouse name: Not on file  . Number of children: Not on file  . Years of education: Not on file  . Highest education level: Not on file  Occupational History  . Not on file  Tobacco Use  . Smoking status: Former Smoker    Types: Cigarettes  . Smokeless tobacco: Never Used  . Tobacco comment: quit in 1986  Vaping Use  . Vaping Use: Never used  Substance and Sexual Activity  . Alcohol use: Not Currently  .  Drug use: Never  . Sexual activity: Not on file  Other Topics Concern  . Not on file  Social History Narrative  . Not on file   Social Determinants of Health   Financial Resource Strain: Not on file  Food Insecurity: Not on file  Transportation Needs: Not on file  Physical Activity: Not on file  Stress: Not on file  Social Connections: Not on file     Family History: The patient's family history includes Hypertension in his brother. There is no history of Heart attack or Heart disease. ROS:   Please see the history of present illness.    All other systems reviewed and are negative.  EKGs/Labs/Other Studies Reviewed:    The following studies were reviewed today:  EKG:  EKG ordered today and personally reviewed.  The ekg ordered today demonstrates sinus bradycardia  Recent Labs: 03/19/2020: ALT 24; BUN 14; Creatinine, Ser 1.00; Hemoglobin 15.7; Platelets 262.0; Potassium 4.4; Sodium 139  Recent Lipid Panel  Component Value Date/Time   CHOL 95 03/19/2020 0924   TRIG 115.0 03/19/2020 0924   HDL 38.40 (L) 03/19/2020 0924   CHOLHDL 2 03/19/2020 0924   VLDL 23.0 03/19/2020 0924   LDLCALC 34 03/19/2020 0924    Physical Exam:    VS:  BP (!) 138/56   Pulse (!) 54   Ht 5\' 6"  (1.676 m)   Wt 150 lb (68 kg)   SpO2 96%   BMI 24.21 kg/m     Wt Readings from Last 3 Encounters:  06/05/20 150 lb (68 kg)  03/23/20 151 lb (68.5 kg)  09/20/19 146 lb (66.2 kg)     GEN:  Well nourished, well developed in no acute distress HEENT: Normal NECK: No JVD; No carotid bruits LYMPHATICS: No lymphadenopathy CARDIAC: RRR, no murmurs, rubs, gallops RESPIRATORY:  Clear to auscultation without rales, wheezing or rhonchi  ABDOMEN: Soft, non-tender, non-distended MUSCULOSKELETAL:  No edema; No deformity  SKIN: Warm and dry NEUROLOGIC:  Alert and oriented x 3 PSYCHIATRIC:  Normal affect    Signed, Shirlee More, MD  06/05/2020 10:08 AM    [place]

## 2020-06-05 ENCOUNTER — Ambulatory Visit: Payer: Medicare Other | Admitting: Cardiology

## 2020-06-05 ENCOUNTER — Other Ambulatory Visit: Payer: Self-pay

## 2020-06-05 ENCOUNTER — Encounter: Payer: Self-pay | Admitting: Cardiology

## 2020-06-05 VITALS — BP 138/56 | HR 54 | Ht 66.0 in | Wt 150.0 lb

## 2020-06-05 DIAGNOSIS — E782 Mixed hyperlipidemia: Secondary | ICD-10-CM | POA: Diagnosis not present

## 2020-06-05 DIAGNOSIS — I10 Essential (primary) hypertension: Secondary | ICD-10-CM | POA: Diagnosis not present

## 2020-06-05 DIAGNOSIS — I2510 Atherosclerotic heart disease of native coronary artery without angina pectoris: Secondary | ICD-10-CM

## 2020-06-05 NOTE — Patient Instructions (Signed)
Medication Instructions:  Your physician recommends that you continue on your current medications as directed. Please refer to the Current Medication list given to you today.  *If you need a refill on your cardiac medications before your next appointment, please call your pharmacy*   Lab Work: None If you have labs (blood work) drawn today and your tests are completely normal, you will receive your results only by: Marland Kitchen MyChart Message (if you have MyChart) OR . A paper copy in the mail If you have any lab test that is abnormal or we need to change your treatment, we will call you to review the results.   Testing/Procedures:   [facility] at [facility] [address] [place], [place] [address] Phone: [phone]    Please arrive 15 minutes prior to your appointment time for registration and insurance purposes.  The test will take approximately 3 to 4 hours to complete; you may bring reading material.  If someone comes with you to your appointment, they will need to remain in the main lobby due to limited space in the testing area. **If you are pregnant or breastfeeding, please notify the nuclear lab prior to your appointment**  How to prepare for your Myocardial Perfusion Test: . Do not eat or drink 3 hours prior to your test, except you may have water. . Do not consume products containing caffeine (regular or decaffeinated) 12 hours prior to your test. (ex: coffee, chocolate, sodas, tea). . Do bring a list of your current medications with you.  If not listed below, you may take your medications as normal. . HOLD diabetic medication/insulin the morning of the test: Metformin. . Do wear comfortable clothes (no dresses or overalls) and walking shoes, tennis shoes preferred (No heels or open toe shoes are allowed). . Do NOT wear cologne, perfume, aftershave, or lotions (deodorant is allowed). . If these instructions are not followed, your test  will have to be rescheduled.  Please report to [address] for your test.  If you have questions or concerns about your appointment, you can call the Nuclear Lab at [phone].  If you cannot keep your appointment, please provide 24 hours notification to the Nuclear Lab, to avoid a possible $50 charge to your account.    Follow-Up: At [facility], you and your health needs are our priority.  As part of our continuing mission to provide you with exceptional heart care, we have created designated Provider Care Teams.  These Care Teams include your primary Cardiologist (physician) and Advanced Practice Providers (APPs -  Physician Assistants and Nurse Practitioners) who all work together to provide you with the care you need, when you need it.  We recommend signing up for the patient portal called "MyChart".  Sign up information is provided on this After Visit Summary.  MyChart is used to connect with patients for Virtual Visits (Telemedicine).  Patients are able to view lab/test results, encounter notes, upcoming appointments, etc.  Non-urgent messages can be sent to your provider as well.   To learn more about what you can do with MyChart, go to [email or web address].    Your next appointment:   6 month(s)  The format for your next appointment:   In Person  Provider:   Shirlee More, MD   Other Instructions

## 2020-06-19 ENCOUNTER — Encounter (HOSPITAL_COMMUNITY): Payer: Self-pay | Admitting: *Deleted

## 2020-06-19 ENCOUNTER — Telehealth (HOSPITAL_COMMUNITY): Payer: Self-pay | Admitting: *Deleted

## 2020-06-19 NOTE — Telephone Encounter (Signed)
Letter sent via [employer] outlining instructions for upcoming stress test on 06/25/20 at 10:30.

## 2020-06-21 ENCOUNTER — Other Ambulatory Visit (HOSPITAL_BASED_OUTPATIENT_CLINIC_OR_DEPARTMENT_OTHER): Payer: Self-pay

## 2020-06-21 ENCOUNTER — Other Ambulatory Visit: Payer: Self-pay | Admitting: Cardiology

## 2020-06-21 MED ORDER — ATORVASTATIN CALCIUM 80 MG PO TABS
80.0000 mg | ORAL_TABLET | Freq: Every day | ORAL | 3 refills | Status: DC
  Filled 2020-06-21 – 2020-06-28 (×2): qty 90, 90d supply, fill #0
  Filled 2020-09-28: qty 90, 90d supply, fill #1
  Filled 2020-12-07: qty 90, 90d supply, fill #2
  Filled 2021-03-07: qty 90, 90d supply, fill #3

## 2020-06-21 MED FILL — Clopidogrel Bisulfate Tab 75 MG (Base Equiv): ORAL | 90 days supply | Qty: 90 | Fill #0 | Status: CN

## 2020-06-21 MED FILL — Mirtazapine Tab 30 MG: ORAL | 90 days supply | Qty: 90 | Fill #0 | Status: CN

## 2020-06-21 NOTE — Addendum Note (Signed)
Addended by: Senaida Ores on: 06/21/2020 03:13 PM   Modules accepted: Orders

## 2020-06-22 ENCOUNTER — Other Ambulatory Visit (HOSPITAL_COMMUNITY)
Admission: RE | Admit: 2020-06-22 | Discharge: 2020-06-22 | Disposition: A | Discharge status: Home / Self Care | Payer: Medicare Other | Source: Ambulatory Visit | Attending: Cardiology | Admitting: Cardiology

## 2020-06-22 ENCOUNTER — Other Ambulatory Visit (HOSPITAL_BASED_OUTPATIENT_CLINIC_OR_DEPARTMENT_OTHER): Payer: Self-pay

## 2020-06-22 DIAGNOSIS — Z01812 Encounter for preprocedural laboratory examination: Secondary | ICD-10-CM | POA: Insufficient documentation

## 2020-06-22 DIAGNOSIS — Z20822 Contact with and (suspected) exposure to covid-19: Secondary | ICD-10-CM | POA: Diagnosis not present

## 2020-06-23 LAB — SARS CORONAVIRUS 2 (TAT 6-24 HRS): SARS Coronavirus 2: NEGATIVE

## 2020-06-25 ENCOUNTER — Other Ambulatory Visit: Payer: Self-pay

## 2020-06-25 ENCOUNTER — Ambulatory Visit (HOSPITAL_COMMUNITY): Payer: Medicare Other | Attending: Cardiology

## 2020-06-25 DIAGNOSIS — I2510 Atherosclerotic heart disease of native coronary artery without angina pectoris: Secondary | ICD-10-CM | POA: Insufficient documentation

## 2020-06-25 LAB — MYOCARDIAL PERFUSION IMAGING
Estimated workload: 8 METS
Exercise duration (min): 6 min
Exercise duration (sec): 43 s
LV dias vol: 59 mL (ref 62–150)
LV sys vol: 21 mL
MPHR: 144 {beats}/min
Peak HR: 115 {beats}/min
Percent HR: 79 %
RPE: 19
Rest HR: 53 {beats}/min
SDS: 0
SRS: 2
SSS: 2
TID: 0.87

## 2020-06-25 MED ORDER — TECHNETIUM TC 99M TETROFOSMIN IV KIT
10.4000 | PACK | Freq: Once | INTRAVENOUS | Status: AC | PRN
  Administered 2020-06-25: 10.4 via INTRAVENOUS
  Filled 2020-06-25: qty 11

## 2020-06-25 MED ORDER — TECHNETIUM TC 99M TETROFOSMIN IV KIT
31.4000 | PACK | Freq: Once | INTRAVENOUS | Status: AC | PRN
  Administered 2020-06-25: 31.4 via INTRAVENOUS
  Filled 2020-06-25: qty 32

## 2020-06-25 MED ORDER — REGADENOSON 0.4 MG/5ML IV SOLN
0.4000 mg | Freq: Once | INTRAVENOUS | Status: AC
  Administered 2020-06-25: 0.4 mg via INTRAVENOUS

## 2020-06-25 NOTE — Addendum Note (Signed)
Addended by: Berniece Salines on: 06/25/2020 09:49 AM   Modules accepted: Orders

## 2020-06-28 ENCOUNTER — Other Ambulatory Visit (HOSPITAL_BASED_OUTPATIENT_CLINIC_OR_DEPARTMENT_OTHER): Payer: Self-pay

## 2020-06-28 MED FILL — Clopidogrel Bisulfate Tab 75 MG (Base Equiv): ORAL | 90 days supply | Qty: 90 | Fill #0 | Status: AC

## 2020-06-28 MED FILL — Mirtazapine Tab 30 MG: ORAL | 90 days supply | Qty: 90 | Fill #0 | Status: AC

## 2020-07-24 ENCOUNTER — Other Ambulatory Visit (HOSPITAL_BASED_OUTPATIENT_CLINIC_OR_DEPARTMENT_OTHER): Payer: Self-pay

## 2020-07-24 MED FILL — Metformin HCl Tab 500 MG: ORAL | 90 days supply | Qty: 180 | Fill #0 | Status: AC

## 2020-07-24 MED FILL — Amlodipine Besylate Tab 5 MG (Base Equivalent): ORAL | 90 days supply | Qty: 90 | Fill #0 | Status: AC

## 2020-07-24 MED FILL — Metoprolol Succinate Tab ER 24HR 50 MG (Tartrate Equiv): ORAL | 90 days supply | Qty: 180 | Fill #0 | Status: AC

## 2020-08-08 ENCOUNTER — Other Ambulatory Visit (HOSPITAL_BASED_OUTPATIENT_CLINIC_OR_DEPARTMENT_OTHER): Payer: Self-pay

## 2020-08-08 ENCOUNTER — Other Ambulatory Visit: Payer: Self-pay | Admitting: Family Medicine

## 2020-08-08 DIAGNOSIS — R413 Other amnesia: Secondary | ICD-10-CM

## 2020-08-08 MED ORDER — PANTOPRAZOLE SODIUM 40 MG PO TBEC
DELAYED_RELEASE_TABLET | Freq: Every day | ORAL | 0 refills | Status: DC
  Filled 2020-08-08: qty 90, 90d supply, fill #0

## 2020-08-08 MED ORDER — DONEPEZIL HCL 5 MG PO TABS
ORAL_TABLET | Freq: Every day | ORAL | 2 refills | Status: DC
Start: 2020-08-08 — End: 2021-03-07
  Filled 2020-08-08: qty 90, 90d supply, fill #0
  Filled 2020-11-05: qty 90, 90d supply, fill #1
  Filled 2021-01-31: qty 90, 90d supply, fill #2

## 2020-08-24 ENCOUNTER — Ambulatory Visit: Payer: Medicare Other | Admitting: Family Medicine

## 2020-09-14 ENCOUNTER — Other Ambulatory Visit: Payer: Medicare Other

## 2020-09-21 ENCOUNTER — Ambulatory Visit: Payer: Medicare Other | Admitting: Family Medicine

## 2020-09-28 ENCOUNTER — Other Ambulatory Visit (HOSPITAL_BASED_OUTPATIENT_CLINIC_OR_DEPARTMENT_OTHER): Payer: Self-pay

## 2020-09-28 ENCOUNTER — Other Ambulatory Visit: Payer: Self-pay | Admitting: Family Medicine

## 2020-09-28 MED ORDER — MIRTAZAPINE 30 MG PO TABS
ORAL_TABLET | Freq: Every day | ORAL | 1 refills | Status: DC
  Filled 2020-09-28: qty 90, 90d supply, fill #0
  Filled 2021-01-14: qty 90, 90d supply, fill #1

## 2020-09-28 MED FILL — Clopidogrel Bisulfate Tab 75 MG (Base Equiv): ORAL | 90 days supply | Qty: 90 | Fill #1 | Status: AC

## 2020-10-05 ENCOUNTER — Other Ambulatory Visit: Payer: Self-pay

## 2020-10-05 ENCOUNTER — Other Ambulatory Visit (INDEPENDENT_AMBULATORY_CARE_PROVIDER_SITE_OTHER): Payer: Medicare Other

## 2020-10-05 DIAGNOSIS — E1165 Type 2 diabetes mellitus with hyperglycemia: Secondary | ICD-10-CM

## 2020-10-05 LAB — LIPID PANEL
Cholesterol: 116 mg/dL (ref 0–200)
HDL: 39 mg/dL — ABNORMAL LOW (ref >39.00)
LDL Cholesterol: 55 mg/dL (ref 0–99)
NonHDL: 76.72
Total CHOL/HDL Ratio: 3
Triglycerides: 109 mg/dL (ref 0.0–149.0)
VLDL: 21.8 mg/dL (ref 0.0–40.0)

## 2020-10-05 LAB — CBC
HCT: 42.8 % (ref 39.0–52.0)
Hemoglobin: 14.4 g/dL (ref 13.0–17.0)
MCHC: 33.7 g/dL (ref 30.0–36.0)
MCV: 92.7 fl (ref 78.0–100.0)
Platelets: 235 10*3/uL (ref 150.0–400.0)
RBC: 4.62 Mil/uL (ref 4.22–5.81)
RDW: 14.4 % (ref 11.5–15.5)
WBC: 6.7 10*3/uL (ref 4.0–10.5)

## 2020-10-05 LAB — COMPREHENSIVE METABOLIC PANEL
ALT: 19 U/L (ref 0–53)
AST: 19 U/L (ref 0–37)
Albumin: 3.8 g/dL (ref 3.5–5.2)
Alkaline Phosphatase: 65 U/L (ref 39–117)
BUN: 13 mg/dL (ref 6–23)
CO2: 28 mEq/L (ref 19–32)
Calcium: 8.7 mg/dL (ref 8.4–10.5)
Chloride: 104 mEq/L (ref 96–112)
Creatinine, Ser: 0.94 mg/dL (ref 0.40–1.50)
GFR: 78.47 mL/min (ref >60.00)
Glucose, Bld: 117 mg/dL — ABNORMAL HIGH (ref 70–99)
Potassium: 4.1 mEq/L (ref 3.5–5.1)
Sodium: 140 mEq/L (ref 135–145)
Total Bilirubin: 1.2 mg/dL (ref 0.2–1.2)
Total Protein: 6.7 g/dL (ref 6.0–8.3)

## 2020-10-05 LAB — HEMOGLOBIN A1C: Hgb A1c MFr Bld: 6.5 % (ref 4.6–6.5)

## 2020-10-12 ENCOUNTER — Ambulatory Visit (INDEPENDENT_AMBULATORY_CARE_PROVIDER_SITE_OTHER): Payer: Medicare Other | Admitting: Family Medicine

## 2020-10-12 ENCOUNTER — Other Ambulatory Visit: Payer: Self-pay

## 2020-10-12 ENCOUNTER — Encounter: Payer: Self-pay | Admitting: Family Medicine

## 2020-10-12 VITALS — BP 122/72 | HR 56 | Temp 97.9°F | Ht 66.0 in | Wt 147.0 lb

## 2020-10-12 DIAGNOSIS — E1165 Type 2 diabetes mellitus with hyperglycemia: Secondary | ICD-10-CM

## 2020-10-12 DIAGNOSIS — H9193 Unspecified hearing loss, bilateral: Secondary | ICD-10-CM

## 2020-10-12 DIAGNOSIS — I10 Essential (primary) hypertension: Secondary | ICD-10-CM | POA: Diagnosis not present

## 2020-10-12 DIAGNOSIS — E782 Mixed hyperlipidemia: Secondary | ICD-10-CM

## 2020-10-12 NOTE — Progress Notes (Signed)
Subjective:   Chief Complaint  Patient presents with   Follow-up    6 month    Bryan Henderson is a 75 y.o. male here for follow-up of diabetes. Here w wife who helps interpret.  Eriksen does not routinely check his sugars.  Patient does not require insulin.   Medications include: metformin 500 mg bid Diet is healthy.  Exercise: walking No CP or SOB.   Hypertension Patient presents for hypertension follow up. He does not monitor home blood pressures. He is compliant with medications- norvasc 5 mg/d, Toprol XL 50 mg/d. Patient has these side effects of medication: none Diet/exercise above.   Hyperlipidemia Patient presents for dyslipidemia follow up. Currently being treated with Lipitor 80 mg/d and compliance with treatment thus far has been good. He denies myalgias. Diet/exercise as above. The patient is not known to have coexisting coronary artery disease.  Past Medical History:  Diagnosis Date   Coronary artery disease    Depression    Diabetes mellitus without complication ([place])    Hypertension    Stroke ([place])      Related testing: Retinal exam: Done Pneumovax: done  Objective:  BP 122/72   Pulse (!) 56   Temp 97.9 F (36.6 C) (Oral)   Ht '5\' 6"'$  (1.676 m)   Wt 147 lb (66.7 kg)   SpO2 94%   BMI 23.73 kg/m  General:  Well developed, well nourished, in no apparent distress Skin:  Warm, no pallor or diaphoresis Head:  Normocephalic, atraumatic Eyes:  Pupils equal and round, sclera anicteric without injection  Lungs:  CTAB, no access msc use Cardio:  RRR, no bruits, no LE edema Musculoskeletal:  Symmetrical muscle groups noted without atrophy or deformity Neuro:  Sensation intact to pinprick on feet Psych: Age appropriate judgment and insight  Assessment:   Type 2 diabetes mellitus with hyperglycemia, without long-term current use of insulin (HCC)  Essential hypertension  Mixed hyperlipidemia  Bilateral hearing loss, unspecified hearing loss type -  Plan: Ambulatory referral to Audiology   Plan:   Chronic, stable. Cont Metformin 500 mg bid. Counseled on diet and exercise. Chronic, stable. Cont Norvasc 5 mg/d, Toprol XL 50 mg/d.  Chronic, stable. Cont Lipitor 80 mg/d. F/u in 6 mo. The patient and his spouse voiced understanding and agreement to the plan.  [place], DO 10/12/20 2:28 PM

## 2020-10-12 NOTE — Patient Instructions (Addendum)
Keep the diet clean and stay active.  For the swelling in your lower extremities, be sure to elevate your legs when able, mind the salt intake, stay physically active and consider wearing compression stockings.  I recommend getting the flu shot in mid October. This suggestion would change if the CDC comes out with a different recommendation.   If you do not hear anything about your referral in the next 1-2 weeks, call our office and ask for an update.  Let us know if you need anything.

## 2020-10-30 ENCOUNTER — Telehealth: Payer: Self-pay | Admitting: Family Medicine

## 2020-10-30 NOTE — Telephone Encounter (Signed)
Left message for patient to call back and schedule Medicare Annual Wellness Visit (AWV) in office.  ° °If not able to come in office, please offer to do virtually or by telephone.  Left office number and my jabber #[phone]. ° °Due for AWVI ° °Please schedule at anytime with Nurse Health Advisor. °  °

## 2020-11-05 ENCOUNTER — Other Ambulatory Visit (HOSPITAL_BASED_OUTPATIENT_CLINIC_OR_DEPARTMENT_OTHER): Payer: Self-pay

## 2020-11-05 ENCOUNTER — Other Ambulatory Visit: Payer: Self-pay | Admitting: Cardiology

## 2020-11-05 ENCOUNTER — Other Ambulatory Visit: Payer: Self-pay | Admitting: Family Medicine

## 2020-11-05 MED ORDER — METFORMIN HCL 500 MG PO TABS
ORAL_TABLET | Freq: Two times a day (BID) | ORAL | 1 refills | Status: DC
  Filled 2020-11-05: qty 180, 90d supply, fill #0
  Filled 2020-12-07 – 2021-01-31 (×2): qty 180, 90d supply, fill #1

## 2020-11-05 MED ORDER — PANTOPRAZOLE SODIUM 40 MG PO TBEC
DELAYED_RELEASE_TABLET | Freq: Every day | ORAL | 0 refills | Status: DC
  Filled 2020-11-05: qty 90, 90d supply, fill #0

## 2020-11-05 MED ORDER — METOPROLOL SUCCINATE ER 50 MG PO TB24
ORAL_TABLET | ORAL | 1 refills | Status: DC
  Filled 2020-11-05: qty 180, 90d supply, fill #0
  Filled 2020-12-07 – 2021-01-31 (×2): qty 180, 90d supply, fill #1

## 2020-11-05 MED FILL — Amlodipine Besylate Tab 5 MG (Base Equivalent): ORAL | 90 days supply | Qty: 90 | Fill #1 | Status: AC

## 2020-11-07 ENCOUNTER — Other Ambulatory Visit (HOSPITAL_BASED_OUTPATIENT_CLINIC_OR_DEPARTMENT_OTHER): Payer: Self-pay

## 2020-12-04 ENCOUNTER — Ambulatory Visit: Payer: Medicare Other | Admitting: Cardiology

## 2020-12-04 ENCOUNTER — Ambulatory Visit: Payer: Medicare Other | Attending: Internal Medicine

## 2020-12-04 ENCOUNTER — Encounter: Payer: Self-pay | Admitting: Cardiology

## 2020-12-04 ENCOUNTER — Other Ambulatory Visit: Payer: Self-pay

## 2020-12-04 ENCOUNTER — Other Ambulatory Visit (HOSPITAL_BASED_OUTPATIENT_CLINIC_OR_DEPARTMENT_OTHER): Payer: Self-pay

## 2020-12-04 VITALS — BP 126/60 | HR 58 | Ht 66.0 in | Wt 145.0 lb

## 2020-12-04 DIAGNOSIS — I2510 Atherosclerotic heart disease of native coronary artery without angina pectoris: Secondary | ICD-10-CM | POA: Diagnosis not present

## 2020-12-04 DIAGNOSIS — I10 Essential (primary) hypertension: Secondary | ICD-10-CM

## 2020-12-04 DIAGNOSIS — Z23 Encounter for immunization: Secondary | ICD-10-CM

## 2020-12-04 DIAGNOSIS — E782 Mixed hyperlipidemia: Secondary | ICD-10-CM

## 2020-12-04 MED ORDER — INFLUENZA VAC A&B SA ADJ QUAD 0.5 ML IM PRSY
PREFILLED_SYRINGE | INTRAMUSCULAR | 0 refills | Status: DC
  Filled 2020-12-04: qty 0.5, 1d supply, fill #0

## 2020-12-04 NOTE — Progress Notes (Signed)
Cardiology Office Note:    Date:  12/04/2020   ID:  Bryan Henderson, DOB June 02, 1945, MRN [id number]  PCP:  Shelda Pal, DO  Cardiologist:  Shirlee More, MD    Referring MD: Shelda Pal*    ASSESSMENT:    1. Coronary artery disease involving native coronary artery of native heart without angina pectoris   2. Essential hypertension   3. Mixed hyperlipidemia    PLAN:    In order of problems listed above:  He continues to do well having no angina after PCI and stent and on current medical therapy New York Heart Association class I he will continue clopidogrel beta-blocker and his high intensity statin.  I reassured his wife there is no evidence of heart failure.  At this time I would not do further cardiac imaging Stable at target continue current treatment including calcium channel blocker that can also contribute to mild dependent edema Continue with statin lipids at target   Next appointment: 6 months   Medication Adjustments/Labs and Tests Ordered: Current medicines are reviewed at length with the patient today.  Concerns regarding medicines are outlined above.  No orders of the defined types were placed in this encounter.  No orders of the defined types were placed in this encounter.   Chief Complaint  Patient presents with   Follow-up   Coronary Artery Disease    History of Present Illness:    Bryan Henderson is a 75 y.o. male with a hx of CAD with non-ST elevation MI 03/22/2018 with PCI and stent to left circumflex coronary artery EF 60% with basal and mid anterolateral hypokinesia, hypertension and hyperlipidemia last seen 06/05/2020.  Compliance with diet, lifestyle and medications: Yes  He takes Aricept has difficulty with hearing socializing and is to go get an audiology evaluation He takes Remeron and has a little bit of dependent edema that can be seen with his age and at times He is not having shortness of breath orthopnea chest pain  palpitation or syncope. He tolerates clopidogrel without GI side effects or bleeding He tolerates statin without muscle pain or weakness lipids are at target I reviewed the results of his myocardial perfusion study with him and his wife  He had residual nonobstructive CAD Cardiac catheterization 03/23/2018:   Mid RCA lesion is 25% stenosed. Prox RCA lesion is 25% stenosed. Dist RCA lesion is 25% stenosed. Post Atrio lesion is 50% stenosed. Mid Cx lesion is 100% stenosed. This was the culprit lesion. A drug-eluting stent was successfully placed using a STENT SYNERGY DES 3X24. Post intervention, there is a 0% residual stenosis. Mid LAD lesion is 25% stenosed. Ost 1st Diag to 1st Diag lesion is 25% stenosed. The left ventricular systolic function is normal. LV end diastolic pressure is normal. The left ventricular ejection fraction is 55-65% by visual estimate. There is no aortic valve stenosis. _____________ Echocardiogram 03/23/2018:   FINDINGS  Left Ventricle: The left ventricle appears to be normal in size, has mild wall thickness normal systolic function with a 99-37% ejection fraction Spectral Doppler shows indeterminate pattern of diastolic filling. There is moderate hypokinesis of the  basiliar-mid anterolateral left ventricular segment. Septal left ventricular hypertrophy. Right Ventricle: The right ventricle is normal in size mildly increased wall thickness has normal systolic function. Left Atrium: The left atrium is normal in size. Right Atrium: The right atrial size is normal in size. Interatrial Septum: No atrial level shunt detected by color flow Doppler  He underwent a myocardial perfusion study 06/25/2020:  Left ventricular function was normal the study was low risk no ischemia was noted. Study Highlights Nuclear stress EF: 65%. The left ventricular ejection fraction is hyperdynamic (>65%). There was no ST segment deviation noted during stress. Defect 1: There is a  medium defect of mild severity present in the basal inferolateral, mid inferolateral, apical inferior and apical lateral location. The study is normal. This is a low risk study.   Normal stress nuclear study with inferolateral thinning but no ischemia; EF 65 with normal wall motion. Past Medical History:  Diagnosis Date   Coronary artery disease    Depression    Diabetes mellitus without complication ([place])    Hypertension    Stroke [facility])     Past Surgical History:  Procedure Laterality Date   CORONARY STENT INTERVENTION N/A 03/23/2018   Procedure: CORONARY STENT INTERVENTION;  Surgeon: Jettie Booze, MD;  Location: [place] [facility];  Service: Cardiovascular;  Laterality: N/A;   LEFT HEART CATH AND CORONARY ANGIOGRAPHY N/A 03/23/2018   Procedure: LEFT HEART CATH AND CORONARY ANGIOGRAPHY;  Surgeon: Jettie Booze, MD;  Location: [place] [facility];  Service: Cardiovascular;  Laterality: N/A;    Current Medications: Current Meds  Medication Sig   amLODipine (NORVASC) 5 MG tablet TAKE 1 TABLET (5 MG TOTAL) BY MOUTH DAILY.   atorvastatin (LIPITOR) 80 MG tablet Take 1 tablet (80 mg total) by mouth daily.   b complex vitamins capsule Take 1 capsule by mouth daily.   clopidogrel (PLAVIX) 75 MG tablet TAKE 1 TABLET BY MOUTH ONCE DAILY   donepezil (ARICEPT) 5 MG tablet TAKE 1 TABLET (5 MG TOTAL) BY MOUTH AT BEDTIME.   metFORMIN (GLUCOPHAGE) 500 MG tablet TAKE 1 TABLET BY MOUTH TWICE DAILY WITH A MEAL   metoprolol succinate (TOPROL-XL) 50 MG 24 hr tablet TAKE 1 TABLET (50 MG TOTAL) BY MOUTH 2 (TWO) TIMES DAILY. TAKE WITH OR IMMEDIATELY FOLLOWING A MEAL.   mirtazapine (REMERON) 30 MG tablet TAKE 1 TABLET BY MOUTH EVERY NIGHT AT BEDTIME   Multiple Vitamin (MULTIVITAMIN) tablet Take 1 tablet by mouth daily.   nitroGLYCERIN (NITROSTAT) 0.4 MG SL tablet Place 1 tablet (0.4 mg total) under the tongue every 5 (five) minutes as needed for chest pain.   Omega-3 Fatty Acids (FISH OIL) 1000  MG CAPS Take 1 capsule by mouth daily.   pantoprazole (PROTONIX) 40 MG tablet TAKE 1 TABLET BY MOUTH ONCE DAILY   vitamin C (ASCORBIC ACID) 500 MG tablet Take 500 mg by mouth daily.     Allergies:   Patient has no known allergies.   Social History   Socioeconomic History   Marital status: Married    Spouse name: Not on file   Number of children: Not on file   Years of education: Not on file   Highest education level: Not on file  Occupational History   Not on file  Tobacco Use   Smoking status: Former    Types: Cigarettes   Smokeless tobacco: Never   Tobacco comments:    quit in 1986  Vaping Use   Vaping Use: Never used  Substance and Sexual Activity   Alcohol use: Not Currently   Drug use: Never   Sexual activity: Not on file  Other Topics Concern   Not on file  Social History Narrative   Not on file   Social Determinants of Health   Financial Resource Strain: Not on file  Food Insecurity: Not on file  Transportation Needs: Not on file  Physical Activity: Not on file  Stress: Not on file  Social Connections: Not on file     Family History: The patient's family history includes Hypertension in his brother. There is no history of Heart attack or Heart disease. ROS:   Please see the history of present illness.    All other systems reviewed and are negative.  EKGs/Labs/Other Studies Reviewed:    The following studies were reviewed today:    Recent Labs: 10/05/2020: ALT 19; BUN 13; Creatinine, Ser 0.94; Hemoglobin 14.4; Platelets 235.0; Potassium 4.1; Sodium 140  Recent Lipid Panel    Component Value Date/Time   CHOL 116 10/05/2020 0833   TRIG 109.0 10/05/2020 0833   HDL 39.00 (L) 10/05/2020 0833   CHOLHDL 3 10/05/2020 0833   VLDL 21.8 10/05/2020 0833   LDLCALC 55 10/05/2020 0833    Physical Exam:    VS:  BP 126/60   Pulse (!) 58   Ht 5\' 6"  (1.676 m)   Wt 145 lb (65.8 kg)   SpO2 95%   BMI 23.40 kg/m     Wt Readings from Last 3 Encounters:   12/04/20 145 lb (65.8 kg)  10/12/20 147 lb (66.7 kg)  06/25/20 150 lb (68 kg)     GEN: He appears apathetic well nourished, well developed in no acute distress HEENT: Normal NECK: No JVD; No carotid bruits LYMPHATICS: No lymphadenopathy CARDIAC: RRR, no murmurs, rubs, gallops RESPIRATORY:  Clear to auscultation without rales, wheezing or rhonchi  ABDOMEN: Soft, non-tender, non-distended MUSCULOSKELETAL:  No edema; No deformity  SKIN: Warm and dry NEUROLOGIC:  Alert and oriented x 3 PSYCHIATRIC:  Normal affect    Signed, Shirlee More, MD  12/04/2020 10:36 AM    [place]

## 2020-12-04 NOTE — Patient Instructions (Signed)
Medication Instructions:  No changes *If you need a refill on your cardiac medications before your next appointment, please call your pharmacy*   Lab Work: None If you have labs (blood work) drawn today and your tests are completely normal, you will receive your results only by: [place] (if you have MyChart) OR A paper copy in the mail If you have any lab test that is abnormal or we need to change your treatment, we will call you to review the results.   Testing/Procedures: None   Follow-Up: At [facility], you and your health needs are our priority.  As part of our continuing mission to provide you with exceptional heart care, we have created designated Provider Care Teams.  These Care Teams include your primary Cardiologist (physician) and Advanced Practice Providers (APPs -  Physician Assistants and Nurse Practitioners) who all work together to provide you with the care you need, when you need it.  We recommend signing up for the patient portal called "MyChart".  Sign up information is provided on this After Visit Summary.  MyChart is used to connect with patients for Virtual Visits (Telemedicine).  Patients are able to view lab/test results, encounter notes, upcoming appointments, etc.  Non-urgent messages can be sent to your provider as well.   To learn more about what you can do with MyChart, go to [email or web address].    Your next appointment:   6 month(s)  The format for your next appointment:   In Person  Provider:   Shirlee More, MD   Other Instructions None

## 2020-12-04 NOTE — Progress Notes (Signed)
   Covid-19 Vaccination Clinic  Name:  Benz Vandenberghe    MRN: [id number] DOB: 1945/07/22  12/04/2020  Mr. Lipkin was observed post Covid-19 immunization for 15 minutes without incident. He was provided with Vaccine Information Sheet and instruction to access the V-Safe system.   Mr. Brum was instructed to call 911 with any severe reactions post vaccine: Difficulty breathing  Swelling of face and throat  A fast heartbeat  A bad rash all over body  Dizziness and weakness

## 2020-12-07 ENCOUNTER — Other Ambulatory Visit: Payer: Self-pay | Admitting: Family Medicine

## 2020-12-07 ENCOUNTER — Other Ambulatory Visit (HOSPITAL_BASED_OUTPATIENT_CLINIC_OR_DEPARTMENT_OTHER): Payer: Self-pay

## 2020-12-07 MED ORDER — PANTOPRAZOLE SODIUM 40 MG PO TBEC
40.0000 mg | DELAYED_RELEASE_TABLET | Freq: Every day | ORAL | 1 refills | Status: DC
  Filled 2020-12-07: qty 90, fill #0
  Filled 2021-01-31: qty 90, 90d supply, fill #0
  Filled 2021-05-06: qty 90, 90d supply, fill #1

## 2020-12-07 MED FILL — Clopidogrel Bisulfate Tab 75 MG (Base Equiv): ORAL | 90 days supply | Qty: 90 | Fill #2 | Status: AC

## 2020-12-07 MED FILL — Amlodipine Besylate Tab 5 MG (Base Equivalent): ORAL | 90 days supply | Qty: 90 | Fill #2 | Status: CN

## 2021-01-14 ENCOUNTER — Other Ambulatory Visit (HOSPITAL_BASED_OUTPATIENT_CLINIC_OR_DEPARTMENT_OTHER): Payer: Self-pay

## 2021-01-31 ENCOUNTER — Other Ambulatory Visit (HOSPITAL_BASED_OUTPATIENT_CLINIC_OR_DEPARTMENT_OTHER): Payer: Self-pay

## 2021-01-31 MED FILL — Amlodipine Besylate Tab 5 MG (Base Equivalent): ORAL | 90 days supply | Qty: 90 | Fill #2 | Status: AC

## 2021-03-07 ENCOUNTER — Other Ambulatory Visit: Payer: Self-pay | Admitting: Family Medicine

## 2021-03-07 ENCOUNTER — Other Ambulatory Visit: Payer: Self-pay | Admitting: Cardiology

## 2021-03-07 ENCOUNTER — Other Ambulatory Visit (HOSPITAL_BASED_OUTPATIENT_CLINIC_OR_DEPARTMENT_OTHER): Payer: Self-pay

## 2021-03-07 DIAGNOSIS — R413 Other amnesia: Secondary | ICD-10-CM

## 2021-03-07 MED ORDER — MIRTAZAPINE 30 MG PO TABS
ORAL_TABLET | Freq: Every day | ORAL | 1 refills | Status: DC
  Filled 2021-03-07: qty 90, fill #0
  Filled 2021-04-15: qty 90, 90d supply, fill #0
  Filled 2021-07-10: qty 90, 90d supply, fill #1

## 2021-03-07 MED ORDER — AMLODIPINE BESYLATE 5 MG PO TABS
ORAL_TABLET | Freq: Every day | ORAL | 3 refills | Status: DC
  Filled 2021-03-07: qty 90, fill #0

## 2021-03-07 MED ORDER — DONEPEZIL HCL 5 MG PO TABS
ORAL_TABLET | Freq: Every day | ORAL | 1 refills | Status: DC
  Filled 2021-03-07: qty 90, fill #0
  Filled 2021-04-15: qty 90, 90d supply, fill #0
  Filled 2021-08-01: qty 90, 90d supply, fill #1

## 2021-03-07 MED ORDER — METOPROLOL SUCCINATE ER 50 MG PO TB24
ORAL_TABLET | ORAL | 1 refills | Status: DC
  Filled 2021-03-07: qty 180, fill #0
  Filled 2021-05-06: qty 180, 90d supply, fill #0
  Filled 2021-08-01: qty 180, 90d supply, fill #1

## 2021-03-07 MED ORDER — CLOPIDOGREL BISULFATE 75 MG PO TABS
75.0000 mg | ORAL_TABLET | Freq: Every day | ORAL | 3 refills | Status: DC
  Filled 2021-03-07: qty 90, 90d supply, fill #0
  Filled 2021-06-10: qty 90, 90d supply, fill #1
  Filled 2021-09-03: qty 90, 90d supply, fill #2

## 2021-03-08 ENCOUNTER — Other Ambulatory Visit (HOSPITAL_BASED_OUTPATIENT_CLINIC_OR_DEPARTMENT_OTHER): Payer: Self-pay

## 2021-03-25 ENCOUNTER — Other Ambulatory Visit (INDEPENDENT_AMBULATORY_CARE_PROVIDER_SITE_OTHER): Payer: Medicare Other

## 2021-03-25 DIAGNOSIS — E1165 Type 2 diabetes mellitus with hyperglycemia: Secondary | ICD-10-CM | POA: Diagnosis not present

## 2021-03-25 LAB — COMPREHENSIVE METABOLIC PANEL
ALT: 21 U/L (ref 0–53)
AST: 21 U/L (ref 0–37)
Albumin: 4 g/dL (ref 3.5–5.2)
Alkaline Phosphatase: 91 U/L (ref 39–117)
BUN: 14 mg/dL (ref 6–23)
CO2: 31 mEq/L (ref 19–32)
Calcium: 9 mg/dL (ref 8.4–10.5)
Chloride: 102 mEq/L (ref 96–112)
Creatinine, Ser: 0.95 mg/dL (ref 0.40–1.50)
GFR: 77.22 mL/min (ref >60.00)
Glucose, Bld: 119 mg/dL — ABNORMAL HIGH (ref 70–99)
Potassium: 3.8 mEq/L (ref 3.5–5.1)
Sodium: 140 mEq/L (ref 135–145)
Total Bilirubin: 0.9 mg/dL (ref 0.2–1.2)
Total Protein: 6.7 g/dL (ref 6.0–8.3)

## 2021-03-25 LAB — LIPID PANEL
Cholesterol: 101 mg/dL (ref 0–200)
HDL: 36.5 mg/dL — ABNORMAL LOW (ref >39.00)
LDL Cholesterol: 41 mg/dL (ref 0–99)
NonHDL: 64.04
Total CHOL/HDL Ratio: 3
Triglycerides: 115 mg/dL (ref 0.0–149.0)
VLDL: 23 mg/dL (ref 0.0–40.0)

## 2021-03-25 LAB — MICROALBUMIN / CREATININE URINE RATIO
Creatinine,U: 62.1 mg/dL
Microalb Creat Ratio: 1.1 mg/g (ref 0.0–30.0)
Microalb, Ur: <0.7 mg/dL (ref 0.0–1.9)

## 2021-03-25 LAB — HEMOGLOBIN A1C: Hgb A1c MFr Bld: 6.5 % (ref 4.6–6.5)

## 2021-04-01 ENCOUNTER — Other Ambulatory Visit (HOSPITAL_BASED_OUTPATIENT_CLINIC_OR_DEPARTMENT_OTHER): Payer: Self-pay

## 2021-04-01 ENCOUNTER — Encounter: Payer: Self-pay | Admitting: Family Medicine

## 2021-04-01 ENCOUNTER — Ambulatory Visit (INDEPENDENT_AMBULATORY_CARE_PROVIDER_SITE_OTHER): Payer: Medicare Other | Admitting: Family Medicine

## 2021-04-01 VITALS — BP 118/68 | HR 53 | Temp 98.0°F | Ht 66.0 in | Wt 149.4 lb

## 2021-04-01 DIAGNOSIS — Z23 Encounter for immunization: Secondary | ICD-10-CM | POA: Diagnosis not present

## 2021-04-01 DIAGNOSIS — I25119 Atherosclerotic heart disease of native coronary artery with unspecified angina pectoris: Secondary | ICD-10-CM

## 2021-04-01 DIAGNOSIS — M5137 Other intervertebral disc degeneration, lumbosacral region: Secondary | ICD-10-CM

## 2021-04-01 DIAGNOSIS — E1165 Type 2 diabetes mellitus with hyperglycemia: Secondary | ICD-10-CM

## 2021-04-01 DIAGNOSIS — Z0000 Encounter for general adult medical examination without abnormal findings: Secondary | ICD-10-CM

## 2021-04-01 MED ORDER — LISINOPRIL 20 MG PO TABS
20.0000 mg | ORAL_TABLET | Freq: Every day | ORAL | 3 refills | Status: DC
  Filled 2021-04-01: qty 90, 90d supply, fill #0
  Filled 2021-06-10: qty 90, 90d supply, fill #1
  Filled 2021-09-03: qty 90, 90d supply, fill #2

## 2021-04-01 NOTE — Patient Instructions (Addendum)
Keep the diet clean and stay active.  We can stop checking sugars at home unless something changes.   Give [place] 2-3 business days to get the results of your labs back.   Stop amlodipine, start lisinopril in its place.   Let us know if you need anything.

## 2021-04-01 NOTE — Progress Notes (Signed)
Chief Complaint  Patient presents with   Annual Exam    Well Male Bryan Henderson is here for a complete physical. Pt's wife is here to help interpret ([place]).  His last physical was >1 year ago.  Current diet: in general, a "healthy" diet.   Current exercise: walking Weight trend: stable Fatigue out of ordinary? No. Seat belt? Yes.   Advanced directive? No  Health maintenance Shingrix- Yes Colonoscopy- Yes Tetanus- Yes Hep C- Yes Pneumonia vaccine- Due PCV20  R lower back pain Chronic issue going on for several years.  He does stretch routinely.  He has been to physical therapy.  Pain is a constant ache.  No recent injury or change in activity.  No neurologic signs or symptoms.  He has not tried anything recently.  Interested in seeing a specialist.  Past Medical History:  Diagnosis Date   Coronary artery disease    Depression    Diabetes mellitus without complication ([place])    Hypertension    Stroke [facility])      Past Surgical History:  Procedure Laterality Date   CORONARY STENT INTERVENTION N/A 03/23/2018   Procedure: CORONARY STENT INTERVENTION;  Surgeon: Jettie Booze, MD;  Location: [place] [facility];  Service: Cardiovascular;  Laterality: N/A;   LEFT HEART CATH AND CORONARY ANGIOGRAPHY N/A 03/23/2018   Procedure: LEFT HEART CATH AND CORONARY ANGIOGRAPHY;  Surgeon: Jettie Booze, MD;  Location: [place] [facility];  Service: Cardiovascular;  Laterality: N/A;    Medications  Current Outpatient Medications on File Prior to Visit  Medication Sig Dispense Refill   amLODipine (NORVASC) 5 MG tablet TAKE 1 TABLET (5 MG TOTAL) BY MOUTH DAILY. 90 tablet 3   atorvastatin (LIPITOR) 80 MG tablet Take 1 tablet (80 mg total) by mouth daily. 90 tablet 3   b complex vitamins capsule Take 1 capsule by mouth daily.     clopidogrel (PLAVIX) 75 MG tablet Take 1 tablet (75 mg total) by mouth daily. 90 tablet 3   donepezil (ARICEPT) 5 MG tablet TAKE 1 TABLET (5 MG TOTAL) BY  MOUTH AT BEDTIME. 90 tablet 1   influenza vaccine adjuvanted (FLUAD) 0.5 ML injection Inject into the muscle. 0.5 mL 0   metFORMIN (GLUCOPHAGE) 500 MG tablet TAKE 1 TABLET BY MOUTH TWICE DAILY WITH A MEAL 180 tablet 1   metoprolol succinate (TOPROL-XL) 50 MG 24 hr tablet TAKE 1 TABLET (50 MG TOTAL) BY MOUTH 2 (TWO) TIMES DAILY. TAKE WITH OR IMMEDIATELY FOLLOWING A MEAL. 180 tablet 1   mirtazapine (REMERON) 30 MG tablet TAKE 1 TABLET BY MOUTH EVERY NIGHT AT BEDTIME 90 tablet 1   Multiple Vitamin (MULTIVITAMIN) tablet Take 1 tablet by mouth daily.     nitroGLYCERIN (NITROSTAT) 0.4 MG SL tablet Place 1 tablet (0.4 mg total) under the tongue every 5 (five) minutes as needed for chest pain. 25 tablet 0   Omega-3 Fatty Acids (FISH OIL) 1000 MG CAPS Take 1 capsule by mouth daily.     pantoprazole (PROTONIX) 40 MG tablet Take 1 tablet (40 mg total) by mouth daily. 90 tablet 1   vitamin C (ASCORBIC ACID) 500 MG tablet Take 500 mg by mouth daily.     Allergies No Known Allergies  Family History Family History  Problem Relation Age of Onset   Hypertension Brother    Heart attack Neg Hx    Heart disease Neg Hx     Review of Systems: Constitutional:  no fevers Eye:  no recent significant change  in vision Ears:  No changes in hearing Nose/Mouth/Throat:  no complaints of nasal congestion, no sore throat Cardiovascular: no chest pain Respiratory:  No shortness of breath Gastrointestinal:  No change in bowel habits GU:  No frequency Integumentary:  no abnormal skin lesions reported Neurologic:  no headaches Endocrine:  denies unexplained weight changes  Exam BP 118/68    Pulse (!) 53    Temp 98 F (36.7 C) (Oral)    Ht 5\' 6"  (1.676 m)    Wt 149 lb 6 oz (67.8 kg)    SpO2 95%    BMI 24.11 kg/m  General:  well developed, well nourished, in no apparent distress Skin:  no significant moles, warts, or growths Head:  no masses, lesions, or tenderness Eyes:  pupils equal and round, sclera anicteric  without injection Ears:  canals without lesions, TMs shiny without retraction, no obvious effusion, no erythema Nose:  nares patent, septum midline, mucosa normal Throat/Pharynx:  lips and gingiva without lesion; tongue and uvula midline; non-inflamed pharynx; no exudates or postnasal drainage Lungs:  clear to auscultation, breath sounds equal bilaterally, no respiratory distress Cardio:  regular rhythm, bradycardic, no LE edema or bruits Rectal: Deferred GI: BS+, S, NT, ND, no masses or organomegaly Musculoskeletal:  symmetrical muscle groups noted without atrophy or deformity, mild TTP over the right lateral lumbar region. Neuro:  gait normal; deep tendon reflexes normal and symmetric Psych: well oriented with normal range of affect and appropriate judgment/insight  Assessment and Plan  Well adult exam  Type 2 diabetes mellitus with hyperglycemia, without long-term current use of insulin ([place]), Chronic - Plan: lisinopril (ZESTRIL) 20 MG tablet, Basic Metabolic Panel (BMET), CANCELED: Basic metabolic panel  Coronary artery disease involving native coronary artery of native heart with angina pectoris ([place]), Chronic  DDD (degenerative disc disease), lumbosacral - Plan: Ambulatory referral to Orthopedic Surgery  Need for vaccination against Streptococcus pneumoniae - Plan: Pneumococcal conjugate vaccine 20-valent (Prevnar 36)   Well 76 y.o. male. Counseled on diet and exercise. Other orders as above. Advanced directive form provided today.  PCV20 today.  Change Norvasc to Lisinopril for better renal protection. He was started on BP meds prior to DM dx.  DDD: Offered physical therapy which they politely declined.  Refer to orthospine for opinion/evaluation. Follow up in 1 week for labs, 6 mo for medication check.  The patient and wife voiced understanding and agreement to the plan.  [place], DO 04/01/21 12:02 PM

## 2021-04-08 ENCOUNTER — Other Ambulatory Visit (INDEPENDENT_AMBULATORY_CARE_PROVIDER_SITE_OTHER): Payer: Medicare Other

## 2021-04-08 DIAGNOSIS — E1165 Type 2 diabetes mellitus with hyperglycemia: Secondary | ICD-10-CM | POA: Diagnosis not present

## 2021-04-09 LAB — BASIC METABOLIC PANEL
BUN: 10 mg/dL (ref 6–23)
CO2: 31 mEq/L (ref 19–32)
Calcium: 8.6 mg/dL (ref 8.4–10.5)
Chloride: 105 mEq/L (ref 96–112)
Creatinine, Ser: 0.92 mg/dL (ref 0.40–1.50)
GFR: 80.23 mL/min (ref >60.00)
Glucose, Bld: 149 mg/dL — ABNORMAL HIGH (ref 70–99)
Potassium: 4.2 mEq/L (ref 3.5–5.1)
Sodium: 138 mEq/L (ref 135–145)

## 2021-04-11 ENCOUNTER — Other Ambulatory Visit (HOSPITAL_BASED_OUTPATIENT_CLINIC_OR_DEPARTMENT_OTHER): Payer: Self-pay

## 2021-04-15 ENCOUNTER — Other Ambulatory Visit (HOSPITAL_BASED_OUTPATIENT_CLINIC_OR_DEPARTMENT_OTHER): Payer: Self-pay

## 2021-04-15 ENCOUNTER — Other Ambulatory Visit: Payer: Self-pay | Admitting: Family Medicine

## 2021-04-15 MED ORDER — METFORMIN HCL 500 MG PO TABS
ORAL_TABLET | Freq: Two times a day (BID) | ORAL | 1 refills | Status: DC
  Filled 2021-04-15: qty 180, 90d supply, fill #0
  Filled 2021-08-01: qty 180, 90d supply, fill #1

## 2021-04-19 DIAGNOSIS — M5451 Vertebrogenic low back pain: Secondary | ICD-10-CM | POA: Diagnosis not present

## 2021-05-06 ENCOUNTER — Other Ambulatory Visit (HOSPITAL_BASED_OUTPATIENT_CLINIC_OR_DEPARTMENT_OTHER): Payer: Self-pay

## 2021-05-14 DIAGNOSIS — H905 Unspecified sensorineural hearing loss: Secondary | ICD-10-CM | POA: Diagnosis not present

## 2021-06-04 NOTE — Progress Notes (Deleted)
?Cardiology Office Note:   ? ?Date:  06/04/2021  ? ?ID:  Bryan Henderson, DOB 19-Apr-1945, MRN [id number] ? ?PCP:  Shelda Pal, DO  ?Cardiologist:  Shirlee More, MD   ? ?Referring MD: Shelda Pal*  ? ? ?ASSESSMENT:   ? ?No diagnosis found. ?PLAN:   ? ?In order of problems listed above: ? ?*** ? ? ?Next appointment: *** ? ? ?Medication Adjustments/Labs and Tests Ordered: ?Current medicines are reviewed at length with the patient today.  Concerns regarding medicines are outlined above.  ?No orders of the defined types were placed in this encounter. ? ?No orders of the defined types were placed in this encounter. ? ? ?No chief complaint on file. ? ? ?History of Present Illness:   ? ?Bryan Henderson is a 76 y.o. 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 ?Compliance with diet, lifestyle and medications: *** ?Past Medical History:  ?Diagnosis Date  ? Coronary artery disease   ? Depression   ? Diabetes mellitus without complication ([place])   ? Hypertension   ? Stroke [facility])   ? ? ?Past Surgical History:  ?Procedure Laterality Date  ? CORONARY STENT INTERVENTION N/A 03/23/2018  ? Procedure: CORONARY STENT INTERVENTION;  Surgeon: Jettie Booze, MD;  Location: [place] [facility];  Service: Cardiovascular;  Laterality: N/A;  ? LEFT HEART CATH AND CORONARY ANGIOGRAPHY N/A 03/23/2018  ? Procedure: LEFT HEART CATH AND CORONARY ANGIOGRAPHY;  Surgeon: Jettie Booze, MD;  Location: [place] [facility];  Service: Cardiovascular;  Laterality: N/A;  ? ? ?Current Medications: ?No outpatient medications have been marked as taking for the 06/05/21 encounter (Appointment) with Richardo Priest, MD.  ?  ? ?Allergies:   Patient has no known allergies.  ? ?Social History  ? ?Socioeconomic History  ? Marital status: Married  ?  Spouse name: Not on  file  ? Number of children: Not on file  ? Years of education: Not on file  ? Highest education level: Not on file  ?Occupational History  ? Not on file  ?Tobacco Use  ? Smoking status: Former  ?  Types: Cigarettes  ? Smokeless tobacco: Never  ? Tobacco comments:  ?  quit in 1986  ?Vaping Use  ? Vaping Use: Never used  ?Substance and Sexual Activity  ? Alcohol use: Not Currently  ? Drug use: Never  ? Sexual activity: Not on file  ?Other Topics Concern  ? Not on file  ?Social History Narrative  ? Not on file  ? ?Social Determinants of Health  ? ?Financial Resource Strain: Not on file  ?Food Insecurity: Not on file  ?Transportation Needs: Not on file  ?Physical Activity: Not on file  ?Stress: Not on file  ?Social Connections: Not on file  ?  ? ?Family History: ?The patient's ***family history includes Hypertension in his brother. There is no history of Heart attack or Heart disease. ?ROS:   ?Please see the history of present illness.    ?All other systems reviewed and are negative. ? ?EKGs/Labs/Other Studies Reviewed:   ? ?The following studies were reviewed today: ?He had residual nonobstructive CAD ?Cardiac catheterization 03/23/2018: ?  ?Mid RCA lesion is 25% stenosed. ?Prox RCA lesion is 25% stenosed. ?Dist RCA lesion is 25% stenosed. ?Post Atrio lesion is 50% stenosed. ?Mid Cx lesion is 100% stenosed. This was  the culprit lesion. ?A drug-eluting stent was successfully placed using a STENT SYNERGY DES 3X24. ?Post intervention, there is a 0% residual stenosis. ?Mid LAD lesion is 25% stenosed. ?Ost 1st Diag to 1st Diag lesion is 25% stenosed. ?The left ventricular systolic function is normal. ?LV end diastolic pressure is normal. ?The left ventricular ejection fraction is 55-65% by visual estimate. ?There is no aortic valve stenosis. ?_____________ ?Echocardiogram 03/23/2018: ?  ?FINDINGS ? Left Ventricle: The left ventricle appears to be normal in size, has mild wall thickness normal systolic function with a 63-84%  ejection fraction Spectral Doppler shows indeterminate pattern of diastolic filling. There is moderate hypokinesis of the  ?basiliar-mid anterolateral left ventricular segment. Septal left ventricular hypertrophy. ?Right Ventricle: The right ventricle is normal in size mildly increased wall thickness has normal systolic function. ?Left Atrium: The left atrium is normal in size. ?Right Atrium: The right atrial size is normal in size. ?Interatrial Septum: No atrial level shunt detected by color flow Doppler ?  ?He underwent a myocardial perfusion study 06/25/2020: ?Left ventricular function was normal the study was low risk no ischemia was noted. ?Study Highlights ?Nuclear stress EF: 65%. ?The left ventricular ejection fraction is hyperdynamic (>65%). ?There was no ST segment deviation noted during stress. ?Defect 1: There is a medium defect of mild severity present in the basal inferolateral, mid inferolateral, apical inferior and apical lateral location. ?The study is normal. ?This is a low risk study. ?EKG:  EKG ordered today and personally reviewed.  The ekg ordered today demonstrates *** ? ?Recent Labs: ?10/05/2020: Hemoglobin 14.4; Platelets 235.0 ?03/25/2021: ALT 21 ?04/08/2021: BUN 10; Creatinine, Ser 0.92; Potassium 4.2; Sodium 138  ?Recent Lipid Panel ?   ?Component Value Date/Time  ? CHOL 101 03/25/2021 0746  ? TRIG 115.0 03/25/2021 0746  ? HDL 36.50 (L) 03/25/2021 0746  ? CHOLHDL 3 03/25/2021 0746  ? VLDL 23.0 03/25/2021 0746  ? [place] 41 03/25/2021 0746  ? ? ?Physical Exam:   ? ?VS:  There were no vitals taken for this visit.   ? ?Wt Readings from Last 3 Encounters:  ?04/01/21 149 lb 6 oz (67.8 kg)  ?12/04/20 145 lb (65.8 kg)  ?10/12/20 147 lb (66.7 kg)  ?  ? ?GEN: *** Well nourished, well developed in no acute distress ?HEENT: Normal ?NECK: No JVD; No carotid bruits ?LYMPHATICS: No lymphadenopathy ?CARDIAC: ***RRR, no murmurs, rubs, gallops ?RESPIRATORY:  Clear to auscultation without rales, wheezing or  rhonchi  ?ABDOMEN: Soft, non-tender, non-distended ?MUSCULOSKELETAL:  No edema; No deformity  ?SKIN: Warm and dry ?NEUROLOGIC:  Alert and oriented x 3 ?PSYCHIATRIC:  Normal affect  ? ? ?Signed, ?Shirlee More, MD  ?06/04/2021 5:28 PM    ?[place]  ?

## 2021-06-05 ENCOUNTER — Ambulatory Visit: Payer: Medicare Other | Admitting: Cardiology

## 2021-06-06 ENCOUNTER — Telehealth: Payer: Self-pay | Admitting: Cardiology

## 2021-06-06 NOTE — Telephone Encounter (Signed)
Informed Glenna Chriscoe, practice administrator regarding this patient concern and she will be calling the patient. ?

## 2021-06-06 NOTE — Telephone Encounter (Signed)
Patient's wife called in really upset because her husbands appointment yesterday with Bryan Henderson in [place] was cancelled. She states she showed up to the office and the doors were locked and that she was never notified of his appt being cancelled. She believes that her husband should be worked in sooner since we are the reason his appointment was cancelled and he needs to have routine care for his heart. She stated that she would sue the office if something happened to her husband and that  they may need to leave [facility] if this is how we run the office. I advised her that Bryan Henderson had a cancellation for today in [place] but she declined. I also stated that we could schedule for next available then add as high priority on the wait list for Bryan Henderson in [place]. She was agreeable to this but still very upset and worried that something might happen to her husband. I told her that we have DOD's and APP's that could see him if there is an urgent matter and all she needed to do was to call the office and let us know. She stated that if it was an urgent matter then she would take him to the ED. She also went on to complain about the wait time on the phones and how ridiculous this process is. I did let her know that we answer for all of the [place] offices in the [place] and not just the [place] office and with that we are going to have longer wait times. Scheduled Bryan Henderson for 06/24/21 at 3pm with Bryan Henderson and he is added to the waitlist as high priority. Sending this as an FYI because she wanted me to let Bryan Henderson know and any higher up's and that she would discuss this with Bryan Henderson when she see's him on 06/24/21. ?

## 2021-06-10 ENCOUNTER — Other Ambulatory Visit: Payer: Self-pay | Admitting: Cardiology

## 2021-06-10 ENCOUNTER — Other Ambulatory Visit (HOSPITAL_BASED_OUTPATIENT_CLINIC_OR_DEPARTMENT_OTHER): Payer: Self-pay

## 2021-06-10 ENCOUNTER — Ambulatory Visit (INDEPENDENT_AMBULATORY_CARE_PROVIDER_SITE_OTHER): Payer: Medicare Other | Admitting: Medical

## 2021-06-10 VITALS — BP 136/70 | HR 62 | Temp 98.4°F | Resp 18 | Ht 66.0 in | Wt 141.4 lb

## 2021-06-10 DIAGNOSIS — J301 Allergic rhinitis due to pollen: Secondary | ICD-10-CM | POA: Diagnosis not present

## 2021-06-10 DIAGNOSIS — R059 Cough, unspecified: Secondary | ICD-10-CM | POA: Diagnosis not present

## 2021-06-10 MED ORDER — FLUTICASONE PROPIONATE 50 MCG/ACT NA SUSP
2.0000 | Freq: Every day | NASAL | 1 refills | Status: DC
  Filled 2021-06-10: qty 16, 30d supply, fill #0
  Filled 2021-07-10: qty 16, 30d supply, fill #1

## 2021-06-10 MED ORDER — ATORVASTATIN CALCIUM 80 MG PO TABS
80.0000 mg | ORAL_TABLET | Freq: Every day | ORAL | 3 refills | Status: DC
  Filled 2021-06-10: qty 90, 90d supply, fill #0
  Filled 2021-09-03: qty 90, 90d supply, fill #1
  Filled 2021-12-27: qty 90, 90d supply, fill #2
  Filled 2022-03-21 – 2022-04-07 (×2): qty 90, 90d supply, fill #3

## 2021-06-10 MED ORDER — LEVOCETIRIZINE DIHYDROCHLORIDE 5 MG PO TABS
5.0000 mg | ORAL_TABLET | Freq: Every evening | ORAL | 3 refills | Status: DC
  Filled 2021-06-10: qty 30, 30d supply, fill #0
  Filled 2021-07-10: qty 30, 30d supply, fill #1
  Filled 2021-08-01 – 2021-08-02 (×3): qty 30, 30d supply, fill #2
  Filled 2021-09-03: qty 30, 30d supply, fill #3

## 2021-06-10 MED ORDER — BENZONATATE 100 MG PO CAPS
100.0000 mg | ORAL_CAPSULE | Freq: Three times a day (TID) | ORAL | 0 refills | Status: DC | PRN
  Filled 2021-06-10: qty 30, 10d supply, fill #0

## 2021-06-10 NOTE — Progress Notes (Signed)
?Cardiology Office Note:   ? ?Date:  06/11/2021  ? ?ID:  Bryan Henderson, DOB 21-Sep-1945, MRN [id number] ? ?PCP:  Shelda Pal, DO  ?Cardiologist:  Shirlee More, MD   ? ?Referring MD: Shelda Pal*  ? ? ?ASSESSMENT:   ? ?1. Coronary artery disease involving native coronary artery of native heart without angina pectoris   ?2. Essential hypertension   ?3. Mixed hyperlipidemia   ? ?PLAN:   ? ?In order of problems listed above: ? ?He continues to do well with his CAD following PCI and stent had a low risk myocardial perfusion study last November having no angina and continue current treatment including clopidogrel high intensity statin beta-blocker. ?Stable continue current treatment ?Continue his high intensity statin lipids are ideal ? ? ?Next appointment: 6 months ? ? ?Medication Adjustments/Labs and Tests Ordered: ?Current medicines are reviewed at length with the patient today.  Concerns regarding medicines are outlined above.  ?Orders Placed This Encounter  ?Procedures  ? EKG 12-Lead  ? ?No orders of the defined types were placed in this encounter. ? ? ?No chief complaint on file. ? ? ?History of Present Illness:   ? ?Bryan Henderson is a 76 y.o. 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  He had a myocardial perfusion study performed in May 2022 showing normal left ventricular ejection fraction normal perfusion. ?Compliance with diet, lifestyle and medications: Yes ? ?They are understandably very upset they came to the office and today there was no physician due to a scheduling mistake. ?Once we got over that he is doing well ?No chest pain shortness of breath palpitation or syncope ?He is having allergies and has been seen with his PCP ?Past Medical History:  ?Diagnosis Date  ? Coronary artery disease   ? Depression   ?  Diabetes mellitus without complication ([place])   ? Hypertension   ? Stroke [facility])   ? ? ?Past Surgical History:  ?Procedure Laterality Date  ? CORONARY STENT INTERVENTION N/A 03/23/2018  ? Procedure: CORONARY STENT INTERVENTION;  Surgeon: Jettie Booze, MD;  Location: [place] [facility];  Service: Cardiovascular;  Laterality: N/A;  ? LEFT HEART CATH AND CORONARY ANGIOGRAPHY N/A 03/23/2018  ? Procedure: LEFT HEART CATH AND CORONARY ANGIOGRAPHY;  Surgeon: Jettie Booze, MD;  Location: [place] [facility];  Service: Cardiovascular;  Laterality: N/A;  ? ? ?Current Medications: ?Current Meds  ?Medication Sig  ? atorvastatin (LIPITOR) 80 MG tablet Take 1 tablet (80 mg total) by mouth daily.  ? b complex vitamins capsule Take 1 capsule by mouth daily.  ? benzonatate (TESSALON) 100 MG capsule Take 1 capsule (100 mg total) by mouth 3 (three) times daily as needed for cough.  ? clopidogrel (PLAVIX) 75 MG tablet Take 1 tablet (75 mg total) by mouth daily.  ? donepezil (ARICEPT) 5 MG tablet TAKE 1 TABLET (5 MG TOTAL) BY MOUTH AT BEDTIME.  ? fluticasone (FLONASE) 50 MCG/ACT nasal spray Place 2 sprays into both nostrils daily.  ? levocetirizine (XYZAL) 5 MG tablet Take 1 tablet (5 mg total) by mouth every evening.  ? lisinopril (ZESTRIL) 20 MG tablet Take 1 tablet (20 mg total) by mouth daily.  ? metFORMIN (GLUCOPHAGE) 500 MG tablet TAKE 1 TABLET BY MOUTH TWICE DAILY WITH A MEAL  ? metoprolol succinate (TOPROL-XL) 50 MG 24 hr tablet  TAKE 1 TABLET (50 MG TOTAL) BY MOUTH 2 (TWO) TIMES DAILY. TAKE WITH OR IMMEDIATELY FOLLOWING A MEAL.  ? mirtazapine (REMERON) 30 MG tablet TAKE 1 TABLET BY MOUTH EVERY NIGHT AT BEDTIME  ? Multiple Vitamin (MULTIVITAMIN) tablet Take 1 tablet by mouth daily.  ? nitroGLYCERIN (NITROSTAT) 0.4 MG SL tablet Place 1 tablet (0.4 mg total) under the tongue every 5 (five) minutes as needed for chest pain.  ? Omega-3 Fatty Acids (FISH OIL) 1000 MG CAPS Take 1 capsule by mouth daily.  ? pantoprazole  (PROTONIX) 40 MG tablet Take 1 tablet (40 mg total) by mouth daily.  ? vitamin C (ASCORBIC ACID) 500 MG tablet Take 500 mg by mouth daily.  ? [DISCONTINUED] influenza vaccine adjuvanted (FLUAD) 0.5 ML injection Inject into the muscle.  ?  ? ?Allergies:   Patient has no known allergies.  ? ?Social History  ? ?Socioeconomic History  ? Marital status: Married  ?  Spouse name: Not on file  ? Number of children: Not on file  ? Years of education: Not on file  ? Highest education level: Not on file  ?Occupational History  ? Not on file  ?Tobacco Use  ? Smoking status: Former  ?  Types: Cigarettes  ? Smokeless tobacco: Never  ? Tobacco comments:  ?  quit in 1986  ?Vaping Use  ? Vaping Use: Never used  ?Substance and Sexual Activity  ? Alcohol use: Not Currently  ? Drug use: Never  ? Sexual activity: Not on file  ?Other Topics Concern  ? Not on file  ?Social History Narrative  ? Not on file  ? ?Social Determinants of Health  ? ?Financial Resource Strain: Not on file  ?Food Insecurity: Not on file  ?Transportation Needs: Not on file  ?Physical Activity: Not on file  ?Stress: Not on file  ?Social Connections: Not on file  ?  ? ?Family History: ?The patient's family history includes Hypertension in his brother. There is no history of Heart attack or Heart disease. ?ROS:   ?Please see the history of present illness.    ?All other systems reviewed and are negative. ? ?EKGs/Labs/Other Studies Reviewed:   ? ?The following studies were reviewed today: ? ?EKG:  EKG ordered today and personally reviewed.  The ekg ordered today demonstrates sinus rhythm and is normal ? ?Recent Labs: ?10/05/2020: Hemoglobin 14.4; Platelets 235.0 ?03/25/2021: ALT 21 ?04/08/2021: BUN 10; Creatinine, Ser 0.92; Potassium 4.2; Sodium 138  ?Recent Lipid Panel ?   ?Component Value Date/Time  ? CHOL 101 03/25/2021 0746  ? TRIG 115.0 03/25/2021 0746  ? HDL 36.50 (L) 03/25/2021 0746  ? CHOLHDL 3 03/25/2021 0746  ? VLDL 23.0 03/25/2021 0746  ? [place] 41 03/25/2021  0746  ? ? ?Physical Exam:   ? ?VS:  BP (!) 146/62   Pulse 71   Ht '5\' 6"'$  (1.676 m)   Wt 140 lb 12.8 oz (63.9 kg)   SpO2 96%   BMI 22.73 kg/m?    ? ?Wt Readings from Last 3 Encounters:  ?06/11/21 140 lb 12.8 oz (63.9 kg)  ?06/10/21 141 lb 6.4 oz (64.1 kg)  ?04/01/21 149 lb 6 oz (67.8 kg)  ?  ? ?GEN:  Well nourished, well developed in no acute distress ?HEENT: Normal ?NECK: No JVD; No carotid bruits ?LYMPHATICS: No lymphadenopathy ?CARDIAC: RRR, no murmurs, rubs, gallops ?RESPIRATORY:  Clear to auscultation without rales, wheezing or rhonchi  ?ABDOMEN: Soft, non-tender, non-distended ?MUSCULOSKELETAL:  No edema; No deformity  ?SKIN: Warm and  dry ?NEUROLOGIC:  Alert and oriented x 3 ?PSYCHIATRIC:  Normal affect  ? ? ?Signed, ?Shirlee More, MD  ?06/11/2021 4:45 PM    ?[place]  ?

## 2021-06-10 NOTE — Progress Notes (Signed)
? ?Subjective:  ? ? Patient ID: Bryan Henderson, male    DOB: Jul 15, 1945, 76 y.o.   MRN: [id number] ? ?HPI ? ?Pt in for 3 days of nasal congestion, runny nose and coughing. Hx of allergies. Pt has been using Robitussin.  ? ?Pt was sneezing in the beginning but not know. Dry cough. ? ?No fever, no chills, no sweats or bodyaches. ? ?In the spring he can get spring allergies. ? ? ? ? ? ?Review of Systems  ?Constitutional:  Negative for chills, fatigue and fever.  ?HENT:  Positive for congestion, postnasal drip and sneezing. Negative for ear pain, sinus pressure, sinus pain and sore throat.   ?Respiratory:  Positive for cough. Negative for shortness of breath and wheezing.   ?Cardiovascular:  Negative for chest pain and palpitations.  ?Gastrointestinal:  Negative for abdominal pain.  ?Genitourinary:  Negative for dysuria.  ?Neurological:  Negative for dizziness, tremors, seizures, syncope, weakness and headaches.  ?Hematological:  Negative for adenopathy. Does not bruise/bleed easily.  ?Psychiatric/Behavioral:  Negative for behavioral problems.   ? ? ?Past Medical History:  ?Diagnosis Date  ? Coronary artery disease   ? Depression   ? Diabetes mellitus without complication ([place])   ? Hypertension   ? Stroke [facility])   ? ?  ?Social History  ? ?Socioeconomic History  ? Marital status: Married  ?  Spouse name: Not on file  ? Number of children: Not on file  ? Years of education: Not on file  ? Highest education level: Not on file  ?Occupational History  ? Not on file  ?Tobacco Use  ? Smoking status: Former  ?  Types: Cigarettes  ? Smokeless tobacco: Never  ? Tobacco comments:  ?  quit in 1986  ?Vaping Use  ? Vaping Use: Never used  ?Substance and Sexual Activity  ? Alcohol use: Not Currently  ? Drug use: Never  ? Sexual activity: Not on file  ?Other Topics Concern  ? Not on file  ?Social History Narrative  ? Not on file  ? ?Social Determinants of Health  ? ?Financial Resource Strain: Not on file  ?Food Insecurity: Not on file   ?Transportation Needs: Not on file  ?Physical Activity: Not on file  ?Stress: Not on file  ?Social Connections: Not on file  ?Intimate Partner Violence: Not on file  ? ? ?Past Surgical History:  ?Procedure Laterality Date  ? CORONARY STENT INTERVENTION N/A 03/23/2018  ? Procedure: CORONARY STENT INTERVENTION;  Surgeon: Jettie Booze, MD;  Location: [place] [facility];  Service: Cardiovascular;  Laterality: N/A;  ? LEFT HEART CATH AND CORONARY ANGIOGRAPHY N/A 03/23/2018  ? Procedure: LEFT HEART CATH AND CORONARY ANGIOGRAPHY;  Surgeon: Jettie Booze, MD;  Location: [place] [facility];  Service: Cardiovascular;  Laterality: N/A;  ? ? ?Family History  ?Problem Relation Age of Onset  ? Hypertension Brother   ? Heart attack Neg Hx   ? Heart disease Neg Hx   ? ? ?No Known Allergies ? ?Current Outpatient Medications on File Prior to Visit  ?Medication Sig Dispense Refill  ? atorvastatin (LIPITOR) 80 MG tablet Take 1 tablet (80 mg total) by mouth daily. 90 tablet 3  ? b complex vitamins capsule Take 1 capsule by mouth daily.    ? clopidogrel (PLAVIX) 75 MG tablet Take 1 tablet (75 mg total) by mouth daily. 90 tablet 3  ? donepezil (ARICEPT) 5 MG tablet TAKE 1 TABLET (5 MG TOTAL) BY MOUTH AT BEDTIME. 90 tablet 1  ?  influenza vaccine adjuvanted (FLUAD) 0.5 ML injection Inject into the muscle. 0.5 mL 0  ? lisinopril (ZESTRIL) 20 MG tablet Take 1 tablet (20 mg total) by mouth daily. 90 tablet 3  ? metFORMIN (GLUCOPHAGE) 500 MG tablet TAKE 1 TABLET BY MOUTH TWICE DAILY WITH A MEAL 180 tablet 1  ? metoprolol succinate (TOPROL-XL) 50 MG 24 hr tablet TAKE 1 TABLET (50 MG TOTAL) BY MOUTH 2 (TWO) TIMES DAILY. TAKE WITH OR IMMEDIATELY FOLLOWING A MEAL. 180 tablet 1  ? mirtazapine (REMERON) 30 MG tablet TAKE 1 TABLET BY MOUTH EVERY NIGHT AT BEDTIME 90 tablet 1  ? Multiple Vitamin (MULTIVITAMIN) tablet Take 1 tablet by mouth daily.    ? nitroGLYCERIN (NITROSTAT) 0.4 MG SL tablet Place 1 tablet (0.4 mg total) under the tongue  every 5 (five) minutes as needed for chest pain. 25 tablet 0  ? Omega-3 Fatty Acids (FISH OIL) 1000 MG CAPS Take 1 capsule by mouth daily.    ? pantoprazole (PROTONIX) 40 MG tablet Take 1 tablet (40 mg total) by mouth daily. 90 tablet 1  ? vitamin C (ASCORBIC ACID) 500 MG tablet Take 500 mg by mouth daily.    ? ?No current facility-administered medications on file prior to visit.  ? ? ?BP 136/70   Pulse 62   Temp 98.4 ?F (36.9 ?C)   Resp 18   Ht '5\' 6"'$  (1.676 m)   Wt 141 lb 6.4 oz (64.1 kg)   SpO2 93%   BMI 22.82 kg/m?  ?  ?   ?Objective:  ? Physical Exam ? ?General- No acute distress. Pleasant patient. ?Neck- Full range of motion, no jvd ?Lungs- Clear, even and unlabored. ?Heart- regular rate and rhythm. ?Neurologic- CNII- XII grossly intact.  ?Heent- boggy turbinates mild boggy. No sinus pressure. +pnd.  ? ? ?   ?Assessment & Plan:  ? ?Patient Instructions  ?Allergic rhinitis- advise start xyzal 5 mg at night and flonase nasal spray. ? ?For cough rx benzonatate. ? ?If sinus pressure or chest congestion/broncihtis let us know and would prescribe antibiotic. ? ?Follow up in 10-14 days or sooner if needed.  ?Mackie Pai, PA-C  ? ? ?Counseld best to go ahead and do covid test within next 24 hours. If + let me know before day 5. ?

## 2021-06-10 NOTE — Patient Instructions (Addendum)
Allergic rhinitis- advise start xyzal 5 mg at night and flonase nasal spray. ? ?For cough rx benzonatate. ? ?If sinus pressure or chest congestion/bronchtis let us know and would prescribe antibiotic. ? ?Follow up in 10-14 days or sooner if needed. ?

## 2021-06-11 ENCOUNTER — Encounter: Payer: Self-pay | Admitting: Cardiology

## 2021-06-11 ENCOUNTER — Ambulatory Visit: Payer: Medicare Other | Admitting: Cardiology

## 2021-06-11 ENCOUNTER — Other Ambulatory Visit (HOSPITAL_BASED_OUTPATIENT_CLINIC_OR_DEPARTMENT_OTHER): Payer: Self-pay

## 2021-06-11 VITALS — BP 146/62 | HR 71 | Ht 66.0 in | Wt 140.8 lb

## 2021-06-11 DIAGNOSIS — E782 Mixed hyperlipidemia: Secondary | ICD-10-CM

## 2021-06-11 DIAGNOSIS — I10 Essential (primary) hypertension: Secondary | ICD-10-CM | POA: Diagnosis not present

## 2021-06-11 DIAGNOSIS — I2510 Atherosclerotic heart disease of native coronary artery without angina pectoris: Secondary | ICD-10-CM

## 2021-06-11 NOTE — Patient Instructions (Signed)

## 2021-06-24 ENCOUNTER — Ambulatory Visit: Payer: Medicare Other | Admitting: Cardiology

## 2021-07-10 ENCOUNTER — Other Ambulatory Visit (HOSPITAL_BASED_OUTPATIENT_CLINIC_OR_DEPARTMENT_OTHER): Payer: Self-pay

## 2021-07-10 ENCOUNTER — Other Ambulatory Visit: Payer: Self-pay | Admitting: Medical

## 2021-07-11 ENCOUNTER — Other Ambulatory Visit (HOSPITAL_BASED_OUTPATIENT_CLINIC_OR_DEPARTMENT_OTHER): Payer: Self-pay

## 2021-07-15 ENCOUNTER — Other Ambulatory Visit (HOSPITAL_BASED_OUTPATIENT_CLINIC_OR_DEPARTMENT_OTHER): Payer: Self-pay

## 2021-08-01 ENCOUNTER — Other Ambulatory Visit: Payer: Self-pay | Admitting: Family Medicine

## 2021-08-01 ENCOUNTER — Other Ambulatory Visit (HOSPITAL_BASED_OUTPATIENT_CLINIC_OR_DEPARTMENT_OTHER): Payer: Self-pay

## 2021-08-01 MED ORDER — PANTOPRAZOLE SODIUM 40 MG PO TBEC
40.0000 mg | DELAYED_RELEASE_TABLET | Freq: Every day | ORAL | 1 refills | Status: DC
  Filled 2021-08-01: qty 90, 90d supply, fill #0
  Filled 2021-11-08: qty 90, 90d supply, fill #1

## 2021-08-02 ENCOUNTER — Other Ambulatory Visit (HOSPITAL_BASED_OUTPATIENT_CLINIC_OR_DEPARTMENT_OTHER): Payer: Self-pay

## 2021-08-12 ENCOUNTER — Other Ambulatory Visit (HOSPITAL_BASED_OUTPATIENT_CLINIC_OR_DEPARTMENT_OTHER): Payer: Self-pay

## 2021-09-03 ENCOUNTER — Other Ambulatory Visit (HOSPITAL_BASED_OUTPATIENT_CLINIC_OR_DEPARTMENT_OTHER): Payer: Self-pay

## 2021-09-11 DIAGNOSIS — H524 Presbyopia: Secondary | ICD-10-CM | POA: Diagnosis not present

## 2021-09-11 DIAGNOSIS — H40023 Open angle with borderline findings, high risk, bilateral: Secondary | ICD-10-CM | POA: Diagnosis not present

## 2021-09-11 DIAGNOSIS — E119 Type 2 diabetes mellitus without complications: Secondary | ICD-10-CM | POA: Diagnosis not present

## 2021-09-11 DIAGNOSIS — H2513 Age-related nuclear cataract, bilateral: Secondary | ICD-10-CM | POA: Diagnosis not present

## 2021-09-11 LAB — HM DIABETES EYE EXAM

## 2021-09-13 ENCOUNTER — Encounter: Payer: Self-pay | Admitting: Family Medicine

## 2021-09-30 ENCOUNTER — Other Ambulatory Visit (HOSPITAL_BASED_OUTPATIENT_CLINIC_OR_DEPARTMENT_OTHER): Payer: Self-pay

## 2021-09-30 ENCOUNTER — Encounter: Payer: Self-pay | Admitting: Family Medicine

## 2021-09-30 ENCOUNTER — Ambulatory Visit (INDEPENDENT_AMBULATORY_CARE_PROVIDER_SITE_OTHER): Payer: Medicare Other | Admitting: Family Medicine

## 2021-09-30 VITALS — BP 138/78 | HR 52 | Temp 98.0°F | Ht 65.0 in | Wt 142.5 lb

## 2021-09-30 DIAGNOSIS — R413 Other amnesia: Secondary | ICD-10-CM | POA: Diagnosis not present

## 2021-09-30 DIAGNOSIS — F321 Major depressive disorder, single episode, moderate: Secondary | ICD-10-CM | POA: Diagnosis not present

## 2021-09-30 DIAGNOSIS — E1165 Type 2 diabetes mellitus with hyperglycemia: Secondary | ICD-10-CM | POA: Diagnosis not present

## 2021-09-30 DIAGNOSIS — I10 Essential (primary) hypertension: Secondary | ICD-10-CM | POA: Diagnosis not present

## 2021-09-30 LAB — TSH: TSH: 1.32 u[IU]/mL (ref 0.35–5.50)

## 2021-09-30 LAB — VITAMIN B12: Vitamin B-12: 844 pg/mL (ref 211–911)

## 2021-09-30 LAB — HEMOGLOBIN A1C: Hgb A1c MFr Bld: 6.7 % — ABNORMAL HIGH (ref 4.6–6.5)

## 2021-09-30 LAB — CBC
HCT: 45.4 % (ref 39.0–52.0)
Hemoglobin: 15.2 g/dL (ref 13.0–17.0)
MCHC: 33.6 g/dL (ref 30.0–36.0)
MCV: 92.7 fl (ref 78.0–100.0)
Platelets: 233 10*3/uL (ref 150.0–400.0)
RBC: 4.89 Mil/uL (ref 4.22–5.81)
RDW: 14.3 % (ref 11.5–15.5)
WBC: 7.5 10*3/uL (ref 4.0–10.5)

## 2021-09-30 MED ORDER — ESCITALOPRAM OXALATE 10 MG PO TABS
ORAL_TABLET | ORAL | 2 refills | Status: DC
  Filled 2021-09-30: qty 22, 30d supply, fill #0

## 2021-09-30 NOTE — Progress Notes (Signed)
Subjective:   Chief Complaint  Patient presents with   Follow-up    Bryan Henderson is a 76 y.o. male here for follow-up of diabetes.   Bryan Henderson does not check sugars.  Patient does not require insulin.   Medications include: metformin 500 mg bid Diet is healthy overall.  Exercise: none currently  Hypertension Patient presents for hypertension follow up. He does not monitor home blood pressures. He is compliant with medications- lisinopril 20 mg/d, Toprol XL 50 mg bid (from cards). Patient has these side effects of medication: none Diet/exercise as above.  No Cp or SOB.   Dementia/depression Getting worse. No hope, amotivation, anhedonia, poor appetite, depressed mood. No HI or SI. No self medication. Not following with a counselor/psychologist.   Past Medical History:  Diagnosis Date   Coronary artery disease    Depression    Diabetes mellitus without complication ([place])    Hypertension    Stroke ([place])      Related testing: Retinal exam: Done Pneumovax: done  Objective:  BP 138/78   Pulse (!) 52   Temp 98 F (36.7 C) (Oral)   Ht '5\' 5"'$  (1.651 m)   Wt 142 lb 8 oz (64.6 kg)   SpO2 98%   BMI 23.71 kg/m  General:  Well developed, well nourished, in no apparent distress Lungs:  CTAB, no access msc use Cardio:  Reg rhythm, bradycardic, no bruits, no LE edema Psych: Age appropriate judgment and insight, flat affect  Assessment:   Type 2 diabetes mellitus with hyperglycemia, without long-term current use of insulin (HCC) - Plan: Comprehensive metabolic panel, CBC, Hemoglobin A1c  Essential hypertension  Depression, major, single episode, moderate (HCC) - Plan: escitalopram (LEXAPRO) 10 MG tablet  Memory deficit - Plan: TSH, B12   Plan:   Chronic, stable. Cont metformin 500 mg bid. Counseled on diet and exercise. Chronic, stable. Cont lisinopril 20 mg/d. He will also cont Toprol as instructed by cards.  Chronic, uncontrolled. Stop Remeron, start Lexapro 5 mg/d  for 2 weeks and then increase to 10 mg/d. Counseling info provided.  Likely 2/2 #3. Ck above labs. If #3 tx ineffective for this, will pursue further workup.  F/u in 1 mo to reck for #3.  The patient voiced understanding and agreement to the plan.  [place], DO 09/30/21 10:34 AM

## 2021-09-30 NOTE — Patient Instructions (Addendum)
Give [place] 2-3 business days to get the results of your labs back.   Make healthy eating choices.   Aim to do some physical exertion for 150 minutes per week. This is typically divided into 5 days per week, 30 minutes per day. The activity should be enough to get your heart rate up. Anything is better than nothing if you have time constraints.  Please consider counseling. Contact [phone] to schedule an appointment or inquire about cost/insurance coverage.  [facility] located at [place], [place], [place].  Phone number = [phone].  Dr. Lennice Sites - Adult Psychiatry.    [facility] located at [place], [place], [place]. Phone number = [phone].   [facility] located at [address], [place], [place].  Phone number = [phone].   [facility] located at [place], [place], [place].  Phone number = [phone].  Let us know if you need anything.

## 2021-10-01 LAB — COMPREHENSIVE METABOLIC PANEL
ALT: 15 U/L (ref 0–53)
AST: 20 U/L (ref 0–37)
Albumin: 4.1 g/dL (ref 3.5–5.2)
Alkaline Phosphatase: 67 U/L (ref 39–117)
BUN: 11 mg/dL (ref 6–23)
CO2: 27 mEq/L (ref 19–32)
Calcium: 9.2 mg/dL (ref 8.4–10.5)
Chloride: 102 mEq/L (ref 96–112)
Creatinine, Ser: 0.97 mg/dL (ref 0.40–1.50)
GFR: 75.04 mL/min (ref >60.00)
Glucose, Bld: 109 mg/dL — ABNORMAL HIGH (ref 70–99)
Potassium: 5.1 mEq/L (ref 3.5–5.1)
Sodium: 139 mEq/L (ref 135–145)
Total Bilirubin: 1 mg/dL (ref 0.2–1.2)
Total Protein: 7.2 g/dL (ref 6.0–8.3)

## 2021-10-10 ENCOUNTER — Other Ambulatory Visit (HOSPITAL_BASED_OUTPATIENT_CLINIC_OR_DEPARTMENT_OTHER): Payer: Self-pay

## 2021-10-10 ENCOUNTER — Telehealth: Payer: Self-pay | Admitting: Family Medicine

## 2021-10-14 ENCOUNTER — Telehealth: Payer: Self-pay | Admitting: Family Medicine

## 2021-10-14 ENCOUNTER — Other Ambulatory Visit (HOSPITAL_BASED_OUTPATIENT_CLINIC_OR_DEPARTMENT_OTHER): Payer: Self-pay

## 2021-10-14 ENCOUNTER — Other Ambulatory Visit: Payer: Self-pay | Admitting: Family Medicine

## 2021-10-14 MED ORDER — MIRTAZAPINE 30 MG PO TABS
30.0000 mg | ORAL_TABLET | Freq: Every day | ORAL | 1 refills | Status: DC
Start: 2021-10-14 — End: 2021-11-22
  Filled 2021-10-14: qty 20, 20d supply, fill #0
  Filled 2021-11-08: qty 90, 90d supply, fill #1
  Filled 2021-11-08: qty 7, 7d supply, fill #1

## 2021-10-14 NOTE — Addendum Note (Signed)
Addended by: Ames Coupe on: 10/14/2021 09:34 AM   Modules accepted: Orders

## 2021-10-14 NOTE — Telephone Encounter (Signed)
Katheren Puller (spouse) called to follow up on med denial. She stated that the pt is looking to to go back to Mirtazapine due to having issues with side effects from taking the Lexapro.

## 2021-10-14 NOTE — Telephone Encounter (Signed)
Patients wife requesting refill on mirtazipine, not on list

## 2021-11-08 ENCOUNTER — Other Ambulatory Visit: Payer: Self-pay | Admitting: Family Medicine

## 2021-11-08 ENCOUNTER — Other Ambulatory Visit (HOSPITAL_BASED_OUTPATIENT_CLINIC_OR_DEPARTMENT_OTHER): Payer: Self-pay

## 2021-11-08 ENCOUNTER — Other Ambulatory Visit: Payer: Self-pay | Admitting: Cardiology

## 2021-11-08 DIAGNOSIS — R413 Other amnesia: Secondary | ICD-10-CM

## 2021-11-08 MED ORDER — METOPROLOL SUCCINATE ER 50 MG PO TB24
50.0000 mg | ORAL_TABLET | Freq: Two times a day (BID) | ORAL | 1 refills | Status: DC
  Filled 2021-11-08: qty 180, 90d supply, fill #0

## 2021-11-08 MED ORDER — METFORMIN HCL 500 MG PO TABS
500.0000 mg | ORAL_TABLET | Freq: Two times a day (BID) | ORAL | 0 refills | Status: DC
  Filled 2021-11-08: qty 180, 90d supply, fill #0

## 2021-11-08 MED ORDER — DONEPEZIL HCL 5 MG PO TABS
5.0000 mg | ORAL_TABLET | Freq: Every day | ORAL | 0 refills | Status: DC
  Filled 2021-11-08: qty 90, 90d supply, fill #0

## 2021-11-11 ENCOUNTER — Ambulatory Visit: Payer: Medicare Other | Admitting: Family Medicine

## 2021-11-20 ENCOUNTER — Ambulatory Visit: Payer: Medicare Other

## 2021-11-20 ENCOUNTER — Telehealth: Payer: Self-pay | Admitting: Cardiology

## 2021-11-20 NOTE — Telephone Encounter (Signed)
Jerimie Mancuso calling in on behalf of the patient, requesting to switch providers from Dr. Bettina Gavia to Dr. Gwenlyn Found due to location convenience.  Please advise as able, Thanks in advance

## 2021-11-22 ENCOUNTER — Encounter: Payer: Self-pay | Admitting: Family Medicine

## 2021-11-22 ENCOUNTER — Other Ambulatory Visit (HOSPITAL_BASED_OUTPATIENT_CLINIC_OR_DEPARTMENT_OTHER): Payer: Self-pay

## 2021-11-22 ENCOUNTER — Ambulatory Visit (INDEPENDENT_AMBULATORY_CARE_PROVIDER_SITE_OTHER): Payer: Medicare Other | Admitting: Family Medicine

## 2021-11-22 VITALS — BP 120/72 | HR 49 | Temp 97.0°F | Ht 66.0 in | Wt 143.2 lb

## 2021-11-22 DIAGNOSIS — F321 Major depressive disorder, single episode, moderate: Secondary | ICD-10-CM

## 2021-11-22 DIAGNOSIS — Z23 Encounter for immunization: Secondary | ICD-10-CM | POA: Diagnosis not present

## 2021-11-22 DIAGNOSIS — K635 Polyp of colon: Secondary | ICD-10-CM

## 2021-11-22 DIAGNOSIS — R053 Chronic cough: Secondary | ICD-10-CM

## 2021-11-22 MED ORDER — OLMESARTAN MEDOXOMIL 20 MG PO TABS
20.0000 mg | ORAL_TABLET | Freq: Every day | ORAL | 2 refills | Status: DC
  Filled 2021-11-22: qty 30, 30d supply, fill #0
  Filled 2021-12-27: qty 30, 30d supply, fill #1

## 2021-11-22 MED ORDER — SERTRALINE HCL 50 MG PO TABS
50.0000 mg | ORAL_TABLET | Freq: Every day | ORAL | 3 refills | Status: DC
  Filled 2021-11-22: qty 25, 25d supply, fill #0

## 2021-11-22 NOTE — Progress Notes (Signed)
Chief Complaint  Patient presents with   Follow-up    Subjective Bryan Henderson presents for f/u depression. Who helps interpret [place]).   Pt is currently being treated with Remeron 30 mg/d.  Reports still having depression and anhedonia, decreased appetite despite treatment. No thoughts of harming self or others. No self-medication with alcohol, prescription drugs or illicit drugs. Pt is not following with a counselor/psychologist.  Past Medical History:  Diagnosis Date   Coronary artery disease    Depression    Diabetes mellitus without complication ([place])    Hypertension    Stroke ([place])    Allergies as of 11/22/2021   No Known Allergies      Medication List        Accurate as of November 22, 2021  1:27 PM. If you have any questions, ask your nurse or doctor.          STOP taking these medications    lisinopril 20 MG tablet Commonly known as: ZESTRIL Stopped by: Shelda Pal, DO   mirtazapine 30 MG tablet Commonly known as: REMERON Stopped by: Shelda Pal, DO       TAKE these medications    ascorbic acid 500 MG tablet Commonly known as: VITAMIN C Take 500 mg by mouth daily.   atorvastatin 80 MG tablet Commonly known as: LIPITOR Take 1 tablet (80 mg total) by mouth daily.   b complex vitamins capsule Take 1 capsule by mouth daily.   clopidogrel 75 MG tablet Commonly known as: PLAVIX Take 1 tablet (75 mg total) by mouth daily.   donepezil 5 MG tablet Commonly known as: ARICEPT Take 1 tablet (5 mg total) by mouth at bedtime.   Fish Oil 1000 MG Caps Take 1 capsule by mouth daily.   metFORMIN 500 MG tablet Commonly known as: GLUCOPHAGE Take 1 tablet (500 mg total) by mouth 2 (two) times daily with a meal.   metoprolol succinate 50 MG 24 hr tablet Commonly known as: TOPROL-XL Take 1 tablet (50 mg total) by mouth 2 (two) times daily.   multivitamin tablet Take 1 tablet by mouth daily.   nitroGLYCERIN 0.4 MG SL  tablet Commonly known as: NITROSTAT Place 1 tablet (0.4 mg total) under the tongue every 5 (five) minutes as needed for chest pain.   olmesartan 20 MG tablet Commonly known as: BENICAR Take 1 tablet (20 mg total) by mouth daily. Started by: Shelda Pal, DO   pantoprazole 40 MG tablet Commonly known as: PROTONIX Take 1 tablet (40 mg total) by mouth daily.   sertraline 50 MG tablet Commonly known as: ZOLOFT Take 1 tablet (50 mg total) by mouth daily. Take 1/2 tab daily for first 2 weeks. Started by: Shelda Pal, DO        Exam BP 120/72 (BP Location: Right Arm, Patient Position: Sitting, Cuff Size: Normal)   Pulse (!) 49   Temp (!) 97 F (36.1 C) (Oral)   Ht '5\' 6"'$  (1.676 m)   Wt 143 lb 4 oz (65 kg)   SpO2 95%   BMI 23.12 kg/m  General:  well developed, well nourished, in no apparent distress Lungs:  No respiratory distress Psych: well oriented with normal range of affect and age-appropriate judgement/insight, alert and oriented x4.  Assessment and Plan  Depression, major, single episode, moderate (HCC) - Plan: sertraline (ZOLOFT) 50 MG tablet  Chronic cough - Plan: olmesartan (BENICAR) 20 MG tablet  Chronic, unstable. Stop Remeron, start Zoloft 25 mg/d for 2 weeks  and then increase to 50 mg/d. Counseled on exercise. Speaks [place] only so counseling will be tough.  F/u in 5 weeks. The patient thru his wife voiced understanding and agreement to the plan.  [place], DO 11/22/21 1:27 PM

## 2021-11-22 NOTE — Addendum Note (Signed)
Addended by: Sharon Seller B on: 11/22/2021 01:37 PM   Modules accepted: Orders

## 2021-11-22 NOTE — Patient Instructions (Signed)
We are done with the Remeron.  Aim to do some physical exertion for 150 minutes per week. This is typically divided into 5 days per week, 30 minutes per day. The activity should be enough to get your heart rate up. Anything is better than nothing if you have time constraints.  Let us know if you need anything.

## 2021-11-29 DIAGNOSIS — H40023 Open angle with borderline findings, high risk, bilateral: Secondary | ICD-10-CM | POA: Diagnosis not present

## 2021-12-27 ENCOUNTER — Ambulatory Visit (INDEPENDENT_AMBULATORY_CARE_PROVIDER_SITE_OTHER): Payer: Medicare Other | Admitting: Family Medicine

## 2021-12-27 ENCOUNTER — Encounter: Payer: Self-pay | Admitting: Family Medicine

## 2021-12-27 ENCOUNTER — Other Ambulatory Visit (HOSPITAL_BASED_OUTPATIENT_CLINIC_OR_DEPARTMENT_OTHER): Payer: Self-pay

## 2021-12-27 ENCOUNTER — Other Ambulatory Visit: Payer: Self-pay | Admitting: Family Medicine

## 2021-12-27 ENCOUNTER — Other Ambulatory Visit: Payer: Self-pay | Admitting: Cardiology

## 2021-12-27 VITALS — BP 128/72 | HR 53 | Temp 98.0°F | Ht 66.0 in | Wt 143.0 lb

## 2021-12-27 DIAGNOSIS — F321 Major depressive disorder, single episode, moderate: Secondary | ICD-10-CM | POA: Diagnosis not present

## 2021-12-27 DIAGNOSIS — E1165 Type 2 diabetes mellitus with hyperglycemia: Secondary | ICD-10-CM | POA: Diagnosis not present

## 2021-12-27 DIAGNOSIS — R413 Other amnesia: Secondary | ICD-10-CM

## 2021-12-27 DIAGNOSIS — R053 Chronic cough: Secondary | ICD-10-CM

## 2021-12-27 MED ORDER — METOPROLOL SUCCINATE ER 50 MG PO TB24
50.0000 mg | ORAL_TABLET | Freq: Two times a day (BID) | ORAL | 1 refills | Status: DC
  Filled 2021-12-27 – 2022-01-27 (×2): qty 180, 90d supply, fill #0
  Filled 2022-04-30: qty 180, 90d supply, fill #1

## 2021-12-27 MED ORDER — OLMESARTAN MEDOXOMIL 20 MG PO TABS
20.0000 mg | ORAL_TABLET | Freq: Every day | ORAL | 2 refills | Status: DC
  Filled 2021-12-27 – 2022-01-27 (×2): qty 90, 90d supply, fill #0
  Filled 2022-04-22 – 2022-04-29 (×2): qty 90, 90d supply, fill #1
  Filled 2022-07-24: qty 90, 90d supply, fill #2

## 2021-12-27 MED ORDER — FAMOTIDINE 20 MG PO TABS
20.0000 mg | ORAL_TABLET | Freq: Two times a day (BID) | ORAL | 2 refills | Status: DC
  Filled 2021-12-27: qty 60, 30d supply, fill #0
  Filled 2022-02-20: qty 60, 30d supply, fill #1
  Filled 2022-05-02: qty 60, 30d supply, fill #2

## 2021-12-27 MED ORDER — CLOPIDOGREL BISULFATE 75 MG PO TABS
75.0000 mg | ORAL_TABLET | Freq: Every day | ORAL | 0 refills | Status: DC
  Filled 2021-12-27: qty 90, 90d supply, fill #0
  Filled 2022-04-10: qty 3, 3d supply, fill #1

## 2021-12-27 MED ORDER — DONEPEZIL HCL 5 MG PO TABS
5.0000 mg | ORAL_TABLET | Freq: Every day | ORAL | 0 refills | Status: DC
  Filled 2021-12-27 – 2022-01-27 (×2): qty 90, 90d supply, fill #0

## 2021-12-27 MED ORDER — MIRTAZAPINE 30 MG PO TABS
30.0000 mg | ORAL_TABLET | Freq: Every day | ORAL | 2 refills | Status: DC
  Filled 2021-12-27 – 2022-02-20 (×2): qty 90, 90d supply, fill #0
  Filled 2022-05-15 – 2022-05-23 (×2): qty 90, 90d supply, fill #1
  Filled 2022-08-18: qty 90, 90d supply, fill #2

## 2021-12-27 NOTE — Patient Instructions (Addendum)
Try some Pepcid/famotidine 20 mg 1-2 times daily as needed. Stay on the Protonix.  The only lifestyle changes that have data behind them are weight loss for the overweight/obese and elevating the head of the bed. Finding out which foods/positions are triggers is important.  Let us know if you need anything.

## 2021-12-27 NOTE — Progress Notes (Signed)
Chief Complaint  Patient presents with   Follow-up    Subjective Bryan Henderson presents for f/u anxiety/depression. Here w spouse who helps interpreter ([place]).   Pt is currently being treated with Zoloft 50 mg/d.  Reports nausea and no improvement since treatment. No thoughts of harming self or others. No self-medication with alcohol, prescription drugs or illicit drugs. Pt is not following with a counselor/psychologist.  Past Medical History:  Diagnosis Date   Coronary artery disease    Depression    Diabetes mellitus without complication ([place])    Hypertension    Stroke ([place])    Allergies as of 12/27/2021   No Known Allergies      Medication List        Accurate as of December 27, 2021 11:17 AM. If you have any questions, ask your nurse or doctor.          STOP taking these medications    lisinopril 20 MG tablet Commonly known as: ZESTRIL Stopped by: Shelda Pal, DO   sertraline 50 MG tablet Commonly known as: ZOLOFT Stopped by: Shelda Pal, DO       TAKE these medications    ascorbic acid 500 MG tablet Commonly known as: VITAMIN C Take 500 mg by mouth daily.   atorvastatin 80 MG tablet Commonly known as: LIPITOR Take 1 tablet (80 mg total) by mouth daily.   b complex vitamins capsule Take 1 capsule by mouth daily.   clopidogrel 75 MG tablet Commonly known as: PLAVIX Take 1 tablet (75 mg total) by mouth daily.   donepezil 5 MG tablet Commonly known as: ARICEPT Take 1 tablet (5 mg total) by mouth at bedtime.   famotidine 20 MG tablet Commonly known as: PEPCID Take 1 tablet (20 mg total) by mouth 2 (two) times daily. Started by: Shelda Pal, DO   Fish Oil 1000 MG Caps Take 1 capsule by mouth daily.   metFORMIN 500 MG tablet Commonly known as: GLUCOPHAGE Take 1 tablet (500 mg total) by mouth 2 (two) times daily with a meal.   metoprolol succinate 50 MG 24 hr tablet Commonly known as: TOPROL-XL Take 1  tablet (50 mg total) by mouth 2 (two) times daily.   mirtazapine 30 MG tablet Commonly known as: REMERON Take 1 tablet (30 mg total) by mouth at bedtime. Started by: Shelda Pal, DO   multivitamin tablet Take 1 tablet by mouth daily.   nitroGLYCERIN 0.4 MG SL tablet Commonly known as: NITROSTAT Place 1 tablet (0.4 mg total) under the tongue every 5 (five) minutes as needed for chest pain.   olmesartan 20 MG tablet Commonly known as: BENICAR Take 1 tablet (20 mg total) by mouth daily.   pantoprazole 40 MG tablet Commonly known as: PROTONIX Take 1 tablet (40 mg total) by mouth daily.        Exam BP 128/72 (BP Location: Left Arm, Patient Position: Sitting, Cuff Size: Normal)   Pulse (!) 53   Temp 98 F (36.7 C) (Oral)   Ht '5\' 6"'$  (1.676 m)   Wt 143 lb (64.9 kg)   SpO2 96%   BMI 23.08 kg/m  General:  well developed, well nourished, in no apparent distress Heart: RRR Abd: BS+, S, NT, ND Lungs:  CTAB. No respiratory distress Psych: nml affect/mood  Assessment and Plan  Depression, major, single episode, moderate (HCC)  Chronic cough - Plan: olmesartan (BENICAR) 20 MG tablet  Type 2 diabetes mellitus with hyperglycemia, without long-term current use of  insulin ([place]) - Plan: CBC, Comprehensive metabolic panel, Lipid panel, Hemoglobin A1c, Microalbumin / creatinine urine ratio  Chronic, unstable. Go back on Remeron 30 mg qhs. Stop Zoloft.  F/u in 4 mo for CPE. Labs 1 week prior. The patient and his spouse voiced understanding and agreement to the plan.  [place], DO 12/27/21 11:17 AM

## 2022-01-07 ENCOUNTER — Ambulatory Visit: Payer: Medicare Other | Admitting: Cardiovascular Disease

## 2022-01-22 ENCOUNTER — Encounter: Payer: Self-pay | Admitting: Family Medicine

## 2022-01-22 ENCOUNTER — Encounter: Payer: Self-pay | Admitting: Gastroenterology

## 2022-01-27 ENCOUNTER — Other Ambulatory Visit (HOSPITAL_BASED_OUTPATIENT_CLINIC_OR_DEPARTMENT_OTHER): Payer: Self-pay

## 2022-01-27 ENCOUNTER — Other Ambulatory Visit: Payer: Self-pay | Admitting: Family Medicine

## 2022-01-27 MED ORDER — METFORMIN HCL 500 MG PO TABS
500.0000 mg | ORAL_TABLET | Freq: Two times a day (BID) | ORAL | 0 refills | Status: DC
  Filled 2022-01-27: qty 180, 90d supply, fill #0

## 2022-01-28 ENCOUNTER — Encounter: Payer: Self-pay | Admitting: Cardiovascular Disease

## 2022-01-28 ENCOUNTER — Ambulatory Visit: Payer: Medicare Other | Attending: Cardiovascular Disease | Admitting: Cardiovascular Disease

## 2022-01-28 VITALS — BP 142/68 | HR 58 | Ht 67.0 in | Wt 146.2 lb

## 2022-01-28 DIAGNOSIS — E782 Mixed hyperlipidemia: Secondary | ICD-10-CM | POA: Diagnosis not present

## 2022-01-28 DIAGNOSIS — I10 Essential (primary) hypertension: Secondary | ICD-10-CM

## 2022-01-28 DIAGNOSIS — I2510 Atherosclerotic heart disease of native coronary artery without angina pectoris: Secondary | ICD-10-CM | POA: Diagnosis not present

## 2022-01-28 DIAGNOSIS — I214 Non-ST elevation (NSTEMI) myocardial infarction: Secondary | ICD-10-CM | POA: Diagnosis not present

## 2022-01-28 NOTE — Assessment & Plan Note (Signed)
History of non-STEMI status post circumflex stenting by Dr. Irish Lack 03/23/2018 with a 3 mm x 24 mm long Synergy drug-eluting stent.  He had normal LV function and no other significant CAD.  He remains on dual antiplatelet therapy including aspirin and clopidogrel.  He is completely asymptomatic.

## 2022-01-28 NOTE — Assessment & Plan Note (Signed)
History of essential hypertension a blood pressure measured today at 142/68.  He is on metoprolol and Benicar.  He apparently was having a dry cough from lisinopril.

## 2022-01-28 NOTE — Assessment & Plan Note (Signed)
History of hyperlipidemia on statin therapy with lipid profile performed 03/25/2021 revealing total cholesterol 101, LDL 41 and HDL 36.

## 2022-01-28 NOTE — Progress Notes (Signed)
01/28/2022 Bryan Henderson   1945-03-26  [id number]  Primary Physician Bryan Henderson, Bryan Oyster, DO Primary Cardiologist: Bryan Harp MD Bryan Henderson, [place]  HPI:  Bryan Henderson is a 76 y.o. thin-appearing married [place] male father of 2 sons, grandfather of 4 grandchildren who was transferring care from Dr. Shirlee Henderson to myself because of proximity.  He is accompanied by his wife Bryan Henderson.  He is retired from being admitted designer close business.  He relocated from [place] to [facility] October 2019.  He smoked cigarettes and drink alcohol remotely and stopped in 1986.  He has treated hypertension, diabetes and hyperlipidemia.  He had a non-STEMI 03/15/2018 and underwent diagnostic coronary angiography by Dr. Irish Henderson revealing a high-grade mid AV groove circumflex which was stented successfully by 3 mm x 24 mm long Synergy drug-eluting stent.  His LV function was normal.  He had a subsequent negative Myoview stress test performed 06/25/2020.  He denies chest pain or shortness of breath.   Current Meds  Medication Sig   atorvastatin (LIPITOR) 80 MG tablet Take 1 tablet (80 mg total) by mouth daily.   b complex vitamins capsule Take 1 capsule by mouth daily.   clopidogrel (PLAVIX) 75 MG tablet Take 1 tablet (75 mg total) by mouth daily.   donepezil (ARICEPT) 5 MG tablet Take 1 tablet (5 mg total) by mouth at bedtime.   famotidine (PEPCID) 20 MG tablet Take 1 tablet (20 mg total) by mouth 2 (two) times daily.   metFORMIN (GLUCOPHAGE) 500 MG tablet Take 1 tablet (500 mg total) by mouth 2 (two) times daily with a meal.   metoprolol succinate (TOPROL-XL) 50 MG 24 hr tablet Take 1 tablet (50 mg total) by mouth 2 (two) times daily.   mirtazapine (REMERON) 30 MG tablet Take 1 tablet (30 mg total) by mouth at bedtime.   Multiple Vitamin (MULTIVITAMIN) tablet Take 1 tablet by mouth daily.   nitroGLYCERIN (NITROSTAT) 0.4 MG SL tablet Place 1 tablet (0.4 mg total) under the tongue every 5  (five) minutes as needed for chest pain.   olmesartan (BENICAR) 20 MG tablet Take 1 tablet (20 mg total) by mouth daily.   Omega-3 Fatty Acids (FISH OIL) 1000 MG CAPS Take 1 capsule by mouth daily.   pantoprazole (PROTONIX) 40 MG tablet Take 1 tablet (40 mg total) by mouth daily.   vitamin C (ASCORBIC ACID) 500 MG tablet Take 500 mg by mouth daily.     No Known Allergies  Social History   Socioeconomic History   Marital status: Married    Spouse name: Not on file   Number of children: Not on file   Years of education: Not on file   Highest education level: Not on file  Occupational History   Not on file  Tobacco Use   Smoking status: Former    Types: Cigarettes   Smokeless tobacco: Never   Tobacco comments:    quit in 1986  Vaping Use   Vaping Use: Never used  Substance and Sexual Activity   Alcohol use: Not Currently   Drug use: Never   Sexual activity: Not on file  Other Topics Concern   Not on file  Social History Narrative   Not on file   Social Determinants of Health   Financial Resource Strain: Not on file  Food Insecurity: Not on file  Transportation Needs: Not on file  Physical Activity: Not on file  Stress: Not on file  Social Connections:  Not on file  Intimate Partner Violence: Not on file     Review of Systems: General: negative for chills, fever, night sweats or weight changes.  Cardiovascular: negative for chest pain, dyspnea on exertion, edema, orthopnea, palpitations, paroxysmal nocturnal dyspnea or shortness of breath Dermatological: negative for rash Respiratory: negative for cough or wheezing Urologic: negative for hematuria Abdominal: negative for nausea, vomiting, diarrhea, bright red blood per rectum, melena, or hematemesis Neurologic: negative for visual changes, syncope, or dizziness All other systems reviewed and are otherwise negative except as noted above.    Blood pressure (!) 142/68, pulse (!) 58, height '5\' 7"'$  (1.702 m), weight  146 lb 3.2 oz (66.3 kg), SpO2 97 %.  General appearance: alert and no distress Neck: no adenopathy, no carotid bruit, no JVD, supple, symmetrical, trachea midline, and thyroid not enlarged, symmetric, no tenderness/mass/nodules Lungs: clear to auscultation bilaterally Heart: regular rate and rhythm, S1, S2 normal, no murmur, click, rub or gallop Extremities: extremities normal, atraumatic, no cyanosis or edema Pulses: 2+ and symmetric Skin: Skin color, texture, turgor normal. No rashes or lesions Neurologic: Grossly normal  EKG sinus bradycardia 58 with RSR prime in lead V1 consistent with RV conduction delay and early R wave transition.  I personally reviewed this EKG.  ASSESSMENT AND PLAN:   NSTEMI (non-ST elevated myocardial infarction) ([place]) History of non-STEMI status post circumflex stenting by Dr. Irish Henderson 03/23/2018 with a 3 mm x 24 mm long Synergy drug-eluting stent.  He had normal LV function and no other significant CAD.  He remains on dual antiplatelet therapy including aspirin and clopidogrel.  He is completely asymptomatic.  Essential hypertension History of essential hypertension a blood pressure measured today at 142/68.  He is on metoprolol and Benicar.  He apparently was having a dry cough from lisinopril.  Hyperlipidemia History of hyperlipidemia on statin therapy with lipid profile performed 03/25/2021 revealing total cholesterol 101, LDL 41 and HDL 36.     Bryan Harp MD [facility] 01/28/2022 3:13 PM

## 2022-01-28 NOTE — Patient Instructions (Addendum)
Medication Instructions:  Your physician recommends that you continue on your current medications as directed. Please refer to the Current Medication list given to you today.  *If you need a refill on your cardiac medications before your next appointment, please call your pharmacy*   Follow-Up: At [facility], you and your health needs are our priority.  As part of our continuing mission to provide you with exceptional heart care, we have created designated Provider Care Teams.  These Care Teams include your primary Cardiologist (physician) and Advanced Practice Providers (APPs -  Physician Assistants and Nurse Practitioners) who all work together to provide you with the care you need, when you need it.  We recommend signing up for the patient portal called "MyChart".  Sign up information is provided on this After Visit Summary.  MyChart is used to connect with patients for Virtual Visits (Telemedicine).  Patients are able to view lab/test results, encounter notes, upcoming appointments, etc.  Non-urgent messages can be sent to your provider as well.   To learn more about what you can do with MyChart, go to [email or web address].    Your next appointment:   12 month(s)  The format for your next appointment:   In Person  Provider:   Jonathan Berry, MD   

## 2022-01-29 ENCOUNTER — Other Ambulatory Visit (HOSPITAL_BASED_OUTPATIENT_CLINIC_OR_DEPARTMENT_OTHER): Payer: Self-pay

## 2022-01-29 MED ORDER — COMIRNATY 30 MCG/0.3ML IM SUSY
PREFILLED_SYRINGE | INTRAMUSCULAR | 0 refills | Status: DC
  Filled 2022-01-29: qty 0.3, 1d supply, fill #0

## 2022-02-20 ENCOUNTER — Other Ambulatory Visit: Payer: Self-pay | Admitting: Family Medicine

## 2022-02-20 ENCOUNTER — Other Ambulatory Visit (HOSPITAL_BASED_OUTPATIENT_CLINIC_OR_DEPARTMENT_OTHER): Payer: Self-pay

## 2022-02-20 MED ORDER — PANTOPRAZOLE SODIUM 40 MG PO TBEC
40.0000 mg | DELAYED_RELEASE_TABLET | Freq: Every day | ORAL | 1 refills | Status: DC
  Filled 2022-02-20: qty 90, 90d supply, fill #0
  Filled 2022-05-15 – 2022-05-23 (×2): qty 90, 90d supply, fill #1

## 2022-03-18 ENCOUNTER — Ambulatory Visit: Payer: Medicare Other | Admitting: Gastroenterology

## 2022-03-18 ENCOUNTER — Encounter: Payer: Self-pay | Admitting: Gastroenterology

## 2022-03-18 ENCOUNTER — Telehealth: Payer: Self-pay

## 2022-03-18 VITALS — BP 118/70 | HR 58 | Ht 67.0 in | Wt 150.0 lb

## 2022-03-18 DIAGNOSIS — R1310 Dysphagia, unspecified: Secondary | ICD-10-CM | POA: Diagnosis not present

## 2022-03-18 DIAGNOSIS — K219 Gastro-esophageal reflux disease without esophagitis: Secondary | ICD-10-CM | POA: Diagnosis not present

## 2022-03-18 DIAGNOSIS — Z86010 Personal history of colonic polyps: Secondary | ICD-10-CM | POA: Diagnosis not present

## 2022-03-18 DIAGNOSIS — R059 Cough, unspecified: Secondary | ICD-10-CM

## 2022-03-18 NOTE — Telephone Encounter (Signed)
   Patient Name: Bryan Henderson  DOB: 1945/07/25 MRN: [id number]  Primary Cardiologist: None  Chart reviewed as part of pre-operative protocol coverage. Given past medical history and time since last visit, based on ACC/AHA guidelines, Bryan Henderson is at acceptable risk for the planned procedure without further cardiovascular testing.   Per office protocol, he may hold Plavix for 5 days prior to EGD/colonoscopy. Please resume Plavix as soon as possible postprocedure, at the discretion of the surgeon.   I will route this recommendation to the requesting party via Epic fax function and remove from pre-op pool.  Please call with questions.  Lenna Sciara, NP 03/18/2022, 11:14 AM

## 2022-03-18 NOTE — Progress Notes (Signed)
Chief Complaint: For GI eval  Referring Provider:  [place], Nicholas [place];   #1. GERD with occ dysphagia and cough. H/O eso leiomyoma (per prev GI notes, but no leiomyoma seen on recent EGD 2019)  #2. H/O polyps   Plan: -Ba swallow with Ba tab -EGD/colon after cardiology clearence and hold plavix 5 days before. -Continue protonix '40mg'$  po QD.   I discussed EGD/Colonoscopy- the indications, risks, alternatives and potential complications including, but not limited to bleeding, infection, reaction to meds, damage to internal organs, cardiac and/or pulmonary problems, and perforation requiring surgery. The possibility that significant findings could be missed was explained. All ? were answered. Pt consents to proceed. HPI:    Bryan Henderson is a 77 y.o. male  [place] who speaks little English (history from his wife) With CAD s/p DES 02/2018 (neg stress test 06/2020, Nl EF) on plavix, HTN, DM2, HLD, cough, H/O eso leiomyoma (per review of GI notes, no mention in the last EGD 2019)  Has been having intermittent cough, seen by Dr. Nani Ravens.  Thought to be due to reflux.  Sent to GI clinic for further evaluation.  Per patient's wife he is also due for colonoscopy. Occ dysphagia-mid chest, both liquids and solids.  No odynophagia.  Cough is not always related to eating. No hearburn with protonix '40mg'$  po QD  No nausea, vomiting, heartburn, regurgitation, odynophagia or dysphagia.  No significant diarrhea or constipation.  No melena or hematochezia. No unintentional weight loss. No abdominal pain.  Seen Dr. Gwenlyn Found in cardiology Dec 2023-note reviewed.  Per wife-would like to get colonoscopy done at the same time as EGD.  He also has good life expectancy despite advanced age.   Past GI workup: [facility] endoscopy EGD 06/09/2017: -Small hiatal hernia -Otherwise normal EGD  Colonoscopy for surveillance for history of polyps 06/09/2017 -3 rectal polyps s/p  polypectomy (Bx-hyperplastic polyps) -Still due to previous history of colon polyps, repeat 5 years  Also from GI notes 05/07/2017 -"Leiomyoma of esophagus"-no mention in the EGD note. -Benign liver cyst   Past Medical History:  Diagnosis Date   Coronary artery disease    Depression    Diabetes mellitus without complication (HCC)    GERD (gastroesophageal reflux disease)    Hyperlipidemia    Hypertension    Stroke [facility])     Past Surgical History:  Procedure Laterality Date   COLONOSCOPY WITH ESOPHAGOGASTRODUODENOSCOPY (EGD)  06/09/2017   [facility] [place]   CORONARY STENT INTERVENTION N/A 03/23/2018   Procedure: CORONARY STENT INTERVENTION;  Surgeon: Jettie Booze, MD;  Location: [facility];  Service: Cardiovascular;  Laterality: N/A;   LEFT HEART CATH AND CORONARY ANGIOGRAPHY N/A 03/23/2018   Procedure: LEFT HEART CATH AND CORONARY ANGIOGRAPHY;  Surgeon: Jettie Booze, MD;  Location: [facility];  Service: Cardiovascular;  Laterality: N/A;    Family History  Problem Relation Age of Onset   Hypertension Brother    Heart attack Neg Hx    Heart disease Neg Hx     Social History   Tobacco Use   Smoking status: Former    Types: Cigarettes   Smokeless tobacco: Never   Tobacco comments:    quit in 1986  Vaping Use   Vaping Use: Never used  Substance Use Topics   Alcohol use: Not Currently   Drug use: Never    Current Outpatient Medications  Medication Sig Dispense Refill   atorvastatin (  LIPITOR) 80 MG tablet Take 1 tablet (80 mg total) by mouth daily. 90 tablet 3   b complex vitamins capsule Take 1 capsule by mouth daily.     clopidogrel (PLAVIX) 75 MG tablet Take 1 tablet (75 mg total) by mouth daily. 90 tablet 0   COVID-19 mRNA vaccine 2023-2024 (COMIRNATY) syringe Inject into the muscle. 0.3 mL 0   donepezil (ARICEPT) 5 MG tablet Take 1 tablet (5 mg total) by mouth at bedtime. 90 tablet 0   famotidine (PEPCID) 20  MG tablet Take 1 tablet (20 mg total) by mouth 2 (two) times daily. 60 tablet 2   metFORMIN (GLUCOPHAGE) 500 MG tablet Take 1 tablet (500 mg total) by mouth 2 (two) times daily with a meal. 180 tablet 0   metoprolol succinate (TOPROL-XL) 50 MG 24 hr tablet Take 1 tablet (50 mg total) by mouth 2 (two) times daily. 180 tablet 1   mirtazapine (REMERON) 30 MG tablet Take 1 tablet (30 mg total) by mouth at bedtime. 90 tablet 2   Multiple Vitamin (MULTIVITAMIN) tablet Take 1 tablet by mouth daily.     nitroGLYCERIN (NITROSTAT) 0.4 MG SL tablet Place 1 tablet (0.4 mg total) under the tongue every 5 (five) minutes as needed for chest pain. 25 tablet 0   olmesartan (BENICAR) 20 MG tablet Take 1 tablet (20 mg total) by mouth daily. 90 tablet 2   Omega-3 Fatty Acids (FISH OIL) 1000 MG CAPS Take 1 capsule by mouth daily.     pantoprazole (PROTONIX) 40 MG tablet Take 1 tablet (40 mg total) by mouth daily. 90 tablet 1   vitamin C (ASCORBIC ACID) 500 MG tablet Take 500 mg by mouth daily.     No current facility-administered medications for this visit.    No Known Allergies  Review of Systems:  Constitutional: Denies fever, chills, diaphoresis, appetite change and fatigue.  HEENT: Denies photophobia, eye pain, redness, hearing loss, ear pain, congestion, sore throat, rhinorrhea, sneezing, mouth sores, neck pain, neck stiffness and tinnitus.   Respiratory: Denies SOB, DOE, cough, chest tightness,  and wheezing.   Cardiovascular: Denies chest pain, palpitations and leg swelling.  Genitourinary: Denies dysuria, urgency, frequency, hematuria, flank pain and difficulty urinating.  Musculoskeletal: Denies myalgias, back pain, joint swelling, arthralgias and gait problem.  Skin: No rash.  Neurological: Denies dizziness, seizures, syncope, weakness, light-headedness, numbness and headaches.  Hematological: Denies adenopathy. Easy bruising, personal or family bleeding history  Psychiatric/Behavioral: No anxiety or  depression     Physical Exam:    BP 118/70   Pulse (!) 58   Ht '5\' 7"'$  (1.702 m)   Wt 150 lb (68 kg)   BMI 23.49 kg/m  Wt Readings from Last 3 Encounters:  03/18/22 150 lb (68 kg)  01/28/22 146 lb 3.2 oz (66.3 kg)  12/27/21 143 lb (64.9 kg)   Constitutional:  Well-developed, in no acute distress. Psychiatric: Normal mood and affect. Behavior is normal. HEENT: Pupils normal.  Conjunctivae are normal. No scleral icterus. Cardiovascular: Normal rate, regular rhythm. No edema Pulmonary/chest: Effort normal and breath sounds normal. No wheezing, rales or rhonchi. Abdominal: Soft, nondistended. Nontender. Bowel sounds active throughout. There are no masses palpable. No hepatomegaly. Rectal: Deferred Neurological: Alert and oriented to person place and time. Skin: Skin is warm and dry. No rashes noted.  Data Reviewed: I have personally reviewed following labs and imaging studies  CBC:    Latest Ref Rng & Units 09/30/2021   10:46 AM 10/05/2020    8:33  AM 03/19/2020    9:24 AM  CBC  WBC 4.0 - 10.5 K/uL 7.5  6.7  7.1   Hemoglobin 13.0 - 17.0 g/dL 15.2  14.4  15.7   Hematocrit 39.0 - 52.0 % 45.4  42.8  47.3   Platelets 150.0 - 400.0 K/uL 233.0  235.0  262.0     CMP:    Latest Ref Rng & Units 09/30/2021   10:46 AM 04/08/2021   10:16 AM 03/25/2021    7:46 AM  CMP  Glucose 70 - 99 mg/dL 109  149  119   BUN 6 - 23 mg/dL '11  10  14   '$ Creatinine 0.40 - 1.50 mg/dL 0.97  0.92  0.95   Sodium 135 - 145 mEq/L 139  138  140   Potassium 3.5 - 5.1 mEq/L 5.1  4.2  3.8   Chloride 96 - 112 mEq/L 102  105  102   CO2 19 - 32 mEq/L '27  31  31   '$ Calcium 8.4 - 10.5 mg/dL 9.2  8.6  9.0   Total Protein 6.0 - 8.3 g/dL 7.2   6.7   Total Bilirubin 0.2 - 1.2 mg/dL 1.0   0.9   Alkaline Phos 39 - 117 U/L 67   91   AST 0 - 37 U/L 20   21   ALT 0 - 53 U/L 15   21       Carmell Austria, MD 03/18/2022, 9:14 AM  Cc: Shelda Pal*

## 2022-03-18 NOTE — Telephone Encounter (Signed)
[facility] Pre-operative Risk Assessment     Request for surgical clearance:     Endoscopy Procedure  What type of surgery is being performed?     EGD/Colon  When is this surgery scheduled?     05-01-2022  What type of clearance is required ?   Pharmacy  Are there any medications that need to be held prior to surgery and how long? Plavix 5 day hold please   Practice name and name of physician performing surgery?      [facility]  What is your office phone and fax number?      Phone- [phone]  [phone]  Anesthesia type (None, local, MAC, general) ?       MAC

## 2022-03-18 NOTE — Patient Instructions (Addendum)
_______________________________________________________  If your blood pressure at your visit was 140/90 or greater, please contact your primary care physician to follow up on this.  _______________________________________________________  If you are age 79 or older, your body mass index should be between 23-30. Your Body mass index is 23.49 kg/m. If this is out of the aforementioned range listed, please consider follow up with your Primary Care Provider.  If you are age 45 or younger, your body mass index should be between 19-25. Your Body mass index is 23.49 kg/m. If this is out of the aformentioned range listed, please consider follow up with your Primary Care Provider.   ________________________________________________________  The  GI providers would like to encourage you to use [facility] to communicate with providers for non-urgent requests or questions.  Due to long hold times on the telephone, sending your provider a message by [facility] may be a faster and more efficient way to get a response.  Please allow 48 business hours for a response.  Please remember that this is for non-urgent requests.  _______________________________________________________  Continue Protonix  You have been scheduled for an endoscopy and colonoscopy. Please follow the written instructions given to you at your visit today. Please pick up your prep supplies at the pharmacy within the next 1-3 days. If you use inhalers (even only as needed), please bring them with you on the day of your procedure.  We have given you instructions in [place] will be contacted by our office prior to your procedure for directions on holding your Plavix.  If you do not hear from our office 2 week prior to your scheduled procedure, please call [phone] to discuss.   You have been scheduled for a Barium Esophogram at [facility] (1st floor of the hospital) on 03-25-2022 at 10am. Please arrive 30 minutes prior to  your appointment for registration. Make certain not to have anything to eat or drink 3 hours prior to your test. If you need to reschedule for any reason, please contact radiology at [phone] to do so. __________________________________________________________________ A barium swallow is an examination that concentrates on views of the esophagus. This tends to be a double contrast exam (barium and two liquids which, when combined, create a gas to distend the wall of the oesophagus) or single contrast (non-ionic iodine based). The study is usually tailored to your symptoms so a good history is essential. Attention is paid during the study to the form, structure and configuration of the esophagus, looking for functional disorders (such as aspiration, dysphagia, achalasia, motility and reflux) EXAMINATION You may be asked to change into a gown, depending on the type of swallow being performed. A radiologist and radiographer will perform the procedure. The radiologist will advise you of the type of contrast selected for your procedure and direct you during the exam. You will be asked to stand, sit or lie in several different positions and to hold a small amount of fluid in your mouth before being asked to swallow while the imaging is performed .In some instances you may be asked to swallow barium coated marshmallows to assess the motility of a solid food bolus. The exam can be recorded as a digital or video fluoroscopy procedure. POST PROCEDURE It will take 1-2 days for the barium to pass through your system. To facilitate this, it is important, unless otherwise directed, to increase your fluids for the next 24-48hrs and to resume your normal diet.  This test typically takes about 30 minutes to perform. __________________________________________________________________________________  Thank you,  Dr. Jackquline Denmark

## 2022-03-19 NOTE — Telephone Encounter (Signed)
Spoke to wife and she voiced understanding regarding this

## 2022-03-21 ENCOUNTER — Other Ambulatory Visit (HOSPITAL_BASED_OUTPATIENT_CLINIC_OR_DEPARTMENT_OTHER): Payer: Self-pay

## 2022-03-21 ENCOUNTER — Other Ambulatory Visit: Payer: Self-pay | Admitting: Cardiovascular Disease

## 2022-03-25 ENCOUNTER — Ambulatory Visit (HOSPITAL_COMMUNITY)
Admission: RE | Admit: 2022-03-25 | Discharge: 2022-03-25 | Disposition: A | Discharge status: Home / Self Care | Payer: Medicare Other | Source: Ambulatory Visit | Attending: Gastroenterology | Admitting: Gastroenterology

## 2022-03-25 DIAGNOSIS — K219 Gastro-esophageal reflux disease without esophagitis: Secondary | ICD-10-CM | POA: Insufficient documentation

## 2022-03-25 DIAGNOSIS — R059 Cough, unspecified: Secondary | ICD-10-CM | POA: Diagnosis not present

## 2022-03-25 DIAGNOSIS — R1310 Dysphagia, unspecified: Secondary | ICD-10-CM | POA: Insufficient documentation

## 2022-03-28 ENCOUNTER — Other Ambulatory Visit (HOSPITAL_BASED_OUTPATIENT_CLINIC_OR_DEPARTMENT_OTHER): Payer: Self-pay

## 2022-03-28 ENCOUNTER — Other Ambulatory Visit: Payer: Self-pay | Admitting: Cardiovascular Disease

## 2022-04-02 ENCOUNTER — Other Ambulatory Visit (HOSPITAL_BASED_OUTPATIENT_CLINIC_OR_DEPARTMENT_OTHER): Payer: Self-pay

## 2022-04-02 ENCOUNTER — Other Ambulatory Visit: Payer: Self-pay | Admitting: Cardiovascular Disease

## 2022-04-07 ENCOUNTER — Other Ambulatory Visit: Payer: Self-pay | Admitting: Cardiovascular Disease

## 2022-04-07 ENCOUNTER — Other Ambulatory Visit: Payer: Self-pay | Admitting: Family Medicine

## 2022-04-07 ENCOUNTER — Other Ambulatory Visit (HOSPITAL_BASED_OUTPATIENT_CLINIC_OR_DEPARTMENT_OTHER): Payer: Self-pay

## 2022-04-07 ENCOUNTER — Other Ambulatory Visit: Payer: Self-pay

## 2022-04-07 DIAGNOSIS — R413 Other amnesia: Secondary | ICD-10-CM

## 2022-04-07 MED ORDER — DONEPEZIL HCL 5 MG PO TABS
5.0000 mg | ORAL_TABLET | Freq: Every day | ORAL | 0 refills | Status: DC
  Filled 2022-04-07: qty 90, 90d supply, fill #0

## 2022-04-08 ENCOUNTER — Other Ambulatory Visit (HOSPITAL_BASED_OUTPATIENT_CLINIC_OR_DEPARTMENT_OTHER): Payer: Self-pay

## 2022-04-10 ENCOUNTER — Other Ambulatory Visit: Payer: Self-pay | Admitting: Cardiovascular Disease

## 2022-04-10 ENCOUNTER — Other Ambulatory Visit (HOSPITAL_BASED_OUTPATIENT_CLINIC_OR_DEPARTMENT_OTHER): Payer: Self-pay

## 2022-04-11 ENCOUNTER — Other Ambulatory Visit (HOSPITAL_BASED_OUTPATIENT_CLINIC_OR_DEPARTMENT_OTHER): Payer: Self-pay

## 2022-04-16 ENCOUNTER — Telehealth: Payer: Self-pay | Admitting: Cardiovascular Disease

## 2022-04-16 ENCOUNTER — Other Ambulatory Visit (HOSPITAL_BASED_OUTPATIENT_CLINIC_OR_DEPARTMENT_OTHER): Payer: Self-pay

## 2022-04-16 MED ORDER — CLOPIDOGREL BISULFATE 75 MG PO TABS
75.0000 mg | ORAL_TABLET | Freq: Every day | ORAL | 1 refills | Status: DC
  Filled 2022-04-16 – 2022-05-02 (×2): qty 90, 90d supply, fill #0
  Filled 2022-07-29: qty 90, 90d supply, fill #1

## 2022-04-16 NOTE — Telephone Encounter (Signed)
*  STAT* If patient is at the pharmacy, call can be transferred to refill team.   1. Which medications need to be refilled? (please list name of each medication and dose if known) clopidogrel (PLAVIX) 75 MG tablet   2. Which pharmacy/location (including street and city if local pharmacy) is medication to be sent to? [place]   3. Do they need a 30 day or 90 day supply? 90 day  Patient out of medication

## 2022-04-23 ENCOUNTER — Other Ambulatory Visit: Payer: Medicare Other

## 2022-04-23 ENCOUNTER — Encounter: Payer: Self-pay | Admitting: Gastroenterology

## 2022-04-25 ENCOUNTER — Other Ambulatory Visit (HOSPITAL_BASED_OUTPATIENT_CLINIC_OR_DEPARTMENT_OTHER): Payer: Self-pay

## 2022-04-28 ENCOUNTER — Other Ambulatory Visit (HOSPITAL_BASED_OUTPATIENT_CLINIC_OR_DEPARTMENT_OTHER): Payer: Self-pay

## 2022-04-29 ENCOUNTER — Encounter: Payer: Self-pay | Admitting: Gastroenterology

## 2022-04-30 ENCOUNTER — Encounter: Payer: Medicare Other | Admitting: Family Medicine

## 2022-04-30 ENCOUNTER — Other Ambulatory Visit: Payer: Self-pay | Admitting: Family Medicine

## 2022-04-30 ENCOUNTER — Other Ambulatory Visit (HOSPITAL_BASED_OUTPATIENT_CLINIC_OR_DEPARTMENT_OTHER): Payer: Self-pay

## 2022-04-30 ENCOUNTER — Other Ambulatory Visit: Payer: Self-pay

## 2022-04-30 MED ORDER — METFORMIN HCL 500 MG PO TABS
500.0000 mg | ORAL_TABLET | Freq: Two times a day (BID) | ORAL | 0 refills | Status: DC
  Filled 2022-04-30: qty 180, 90d supply, fill #0

## 2022-05-01 ENCOUNTER — Encounter: Payer: Medicare Other | Admitting: Gastroenterology

## 2022-05-02 ENCOUNTER — Other Ambulatory Visit: Payer: Self-pay | Admitting: Family Medicine

## 2022-05-02 ENCOUNTER — Other Ambulatory Visit (HOSPITAL_BASED_OUTPATIENT_CLINIC_OR_DEPARTMENT_OTHER): Payer: Self-pay

## 2022-05-02 DIAGNOSIS — R413 Other amnesia: Secondary | ICD-10-CM

## 2022-05-02 MED ORDER — DONEPEZIL HCL 5 MG PO TABS
5.0000 mg | ORAL_TABLET | Freq: Every day | ORAL | 0 refills | Status: DC
  Filled 2022-05-02 – 2022-07-29 (×2): qty 90, 90d supply, fill #0

## 2022-05-07 ENCOUNTER — Other Ambulatory Visit (INDEPENDENT_AMBULATORY_CARE_PROVIDER_SITE_OTHER): Payer: Medicare Other

## 2022-05-07 DIAGNOSIS — E1165 Type 2 diabetes mellitus with hyperglycemia: Secondary | ICD-10-CM | POA: Diagnosis not present

## 2022-05-07 LAB — HEMOGLOBIN A1C: Hgb A1c MFr Bld: 6.8 % — ABNORMAL HIGH (ref 4.6–6.5)

## 2022-05-07 LAB — LIPID PANEL
Cholesterol: 89 mg/dL (ref 0–200)
HDL: 39.2 mg/dL (ref >39.00)
LDL Cholesterol: 36 mg/dL (ref 0–99)
NonHDL: 49.9
Total CHOL/HDL Ratio: 2
Triglycerides: 72 mg/dL (ref 0.0–149.0)
VLDL: 14.4 mg/dL (ref 0.0–40.0)

## 2022-05-07 LAB — COMPREHENSIVE METABOLIC PANEL
ALT: 14 U/L (ref 0–53)
AST: 17 U/L (ref 0–37)
Albumin: 3.6 g/dL (ref 3.5–5.2)
Alkaline Phosphatase: 83 U/L (ref 39–117)
BUN: 16 mg/dL (ref 6–23)
CO2: 29 mEq/L (ref 19–32)
Calcium: 8.8 mg/dL (ref 8.4–10.5)
Chloride: 104 mEq/L (ref 96–112)
Creatinine, Ser: 1 mg/dL (ref 0.40–1.50)
GFR: 72.05 mL/min (ref >60.00)
Glucose, Bld: 127 mg/dL — ABNORMAL HIGH (ref 70–99)
Potassium: 3.9 mEq/L (ref 3.5–5.1)
Sodium: 140 mEq/L (ref 135–145)
Total Bilirubin: 0.8 mg/dL (ref 0.2–1.2)
Total Protein: 6.2 g/dL (ref 6.0–8.3)

## 2022-05-07 LAB — MICROALBUMIN / CREATININE URINE RATIO
Creatinine,U: 76 mg/dL
Microalb Creat Ratio: 1.3 mg/g (ref 0.0–30.0)
Microalb, Ur: 1 mg/dL (ref 0.0–1.9)

## 2022-05-07 LAB — CBC
HCT: 42.1 % (ref 39.0–52.0)
Hemoglobin: 14.4 g/dL (ref 13.0–17.0)
MCHC: 34.3 g/dL (ref 30.0–36.0)
MCV: 90.7 fl (ref 78.0–100.0)
Platelets: 214 10*3/uL (ref 150.0–400.0)
RBC: 4.64 Mil/uL (ref 4.22–5.81)
RDW: 14.5 % (ref 11.5–15.5)
WBC: 5.9 10*3/uL (ref 4.0–10.5)

## 2022-05-14 ENCOUNTER — Encounter: Payer: Self-pay | Admitting: Family Medicine

## 2022-05-14 ENCOUNTER — Ambulatory Visit (INDEPENDENT_AMBULATORY_CARE_PROVIDER_SITE_OTHER): Payer: Medicare Other | Admitting: Family Medicine

## 2022-05-14 VITALS — BP 128/80 | HR 54 | Temp 98.0°F | Ht 66.0 in | Wt 150.0 lb

## 2022-05-14 DIAGNOSIS — E1165 Type 2 diabetes mellitus with hyperglycemia: Secondary | ICD-10-CM | POA: Diagnosis not present

## 2022-05-14 DIAGNOSIS — Z0000 Encounter for general adult medical examination without abnormal findings: Secondary | ICD-10-CM | POA: Diagnosis not present

## 2022-05-14 NOTE — Patient Instructions (Addendum)
Keep the diet clean and stay active. Add some weight resistance exercise to your regimen.   Please get me a copy of your advanced directive form at your convenience.   OK to use Debrox (peroxide) in the ear to loosen up wax. Also recommend using a bulb syringe (for removing boogers from baby's noses) to flush through warm water and vinegar (3-4:1 ratio). An alternative, though more expensive, is an elephant ear washer wax removal kit. Do not use Q-tips as this can impact wax further.  Let us know if you need anything.

## 2022-05-14 NOTE — Progress Notes (Signed)
Chief Complaint  Patient presents with   Annual Exam    Well Male Bryan Henderson is here for a complete physical.  Here w spouse who helps interpret ([place]).  His last physical was >1 year ago.  Current diet: in general, a "healthy" diet.   Current exercise: walking Weight trend: up a few lbs Fatigue out of ordinary? No. Seat belt? Yes.   Advanced directive? No  Health maintenance Shingrix- Yes Colonoscopy- Yes Tetanus- Yes Hep C- Yes Pneumonia vaccine- Yes  Past Medical History:  Diagnosis Date   Coronary artery disease    Depression    Diabetes mellitus without complication (HCC)    GERD (gastroesophageal reflux disease)    Hyperlipidemia    Hypertension    Stroke [facility])      Past Surgical History:  Procedure Laterality Date   COLONOSCOPY WITH ESOPHAGOGASTRODUODENOSCOPY (EGD)  06/09/2017   [facility]. [place]   CORONARY STENT INTERVENTION N/A 03/23/2018   Procedure: CORONARY STENT INTERVENTION;  Surgeon: Jettie Booze, MD;  Location: [place] [facility];  Service: Cardiovascular;  Laterality: N/A;   LEFT HEART CATH AND CORONARY ANGIOGRAPHY N/A 03/23/2018   Procedure: LEFT HEART CATH AND CORONARY ANGIOGRAPHY;  Surgeon: Jettie Booze, MD;  Location: [place] [facility];  Service: Cardiovascular;  Laterality: N/A;    Medications  Current Outpatient Medications on File Prior to Visit  Medication Sig Dispense Refill   atorvastatin (LIPITOR) 80 MG tablet Take 1 tablet (80 mg total) by mouth daily. 90 tablet 3   clopidogrel (PLAVIX) 75 MG tablet Take 1 tablet (75 mg total) by mouth daily. 90 tablet 1   donepezil (ARICEPT) 5 MG tablet Take 1 tablet (5 mg total) by mouth at bedtime. 90 tablet 0   famotidine (PEPCID) 20 MG tablet Take 1 tablet (20 mg total) by mouth 2 (two) times daily. 60 tablet 2   metFORMIN (GLUCOPHAGE) 500 MG tablet Take 1 tablet (500 mg total) by mouth 2 (two) times daily with a meal. 180 tablet 0   metoprolol succinate  (TOPROL-XL) 50 MG 24 hr tablet Take 1 tablet (50 mg total) by mouth 2 (two) times daily. 180 tablet 1   mirtazapine (REMERON) 30 MG tablet Take 1 tablet (30 mg total) by mouth at bedtime. 90 tablet 2   Multiple Vitamin (MULTIVITAMIN) tablet Take 1 tablet by mouth daily.     nitroGLYCERIN (NITROSTAT) 0.4 MG SL tablet Place 1 tablet (0.4 mg total) under the tongue every 5 (five) minutes as needed for chest pain. 25 tablet 0   olmesartan (BENICAR) 20 MG tablet Take 1 tablet (20 mg total) by mouth daily. 90 tablet 2   pantoprazole (PROTONIX) 40 MG tablet Take 1 tablet (40 mg total) by mouth daily. 90 tablet 1    Allergies No Known Allergies  Family History Family History  Problem Relation Age of Onset   Hypertension Brother    Heart attack Neg Hx    Heart disease Neg Hx     Review of Systems: Constitutional:  no fevers Eye:  no recent significant change in vision Ears:  No changes in hearing Nose/Mouth/Throat:  no complaints of nasal congestion, no sore throat Cardiovascular: no chest pain Respiratory:  No shortness of breath Gastrointestinal:  No change in bowel habits GU:  No frequency Integumentary:  no abnormal skin lesions reported Neurologic:  no headaches Endocrine:  denies unexplained weight changes  Exam BP 128/80 (BP Location: Left Arm, Patient Position: Sitting, Cuff Size: Normal)  Pulse (!) 54   Temp 98 F (36.7 C) (Oral)   Ht 5\' 6"  (1.676 m)   Wt 150 lb (68 kg)   SpO2 94%   BMI 24.21 kg/m  General:  well developed, well nourished, in no apparent distress Skin:  no significant moles, warts, or growths Head:  no masses, lesions, or tenderness Eyes:  pupils equal and round, sclera anicteric without injection Ears:  canals without lesions, TMs shiny without retraction, no obvious effusion, no erythema Nose:  nares patent, mucosa normal Throat/Pharynx:  lips and gingiva without lesion; tongue and uvula midline; non-inflamed pharynx; no exudates or postnasal  drainage Lungs:  clear to auscultation, breath sounds equal bilaterally, no respiratory distress Cardio:  regular rate and rhythm, no LE edema or bruits Rectal: Deferred GI: BS+, S, NT, ND, no masses or organomegaly Musculoskeletal:  symmetrical muscle groups noted without atrophy or deformity Neuro:  gait normal; deep tendon reflexes normal and symmetric Psych: well oriented with normal range of affect and appropriate judgment/insight  Assessment and Plan  Well adult exam  Type 2 diabetes mellitus with hyperglycemia, without long-term current use of insulin ([place])   Well 77 y.o. male. Counseled on diet and exercise. Advanced directive form provided today.  Other orders as above. Follow up in 6 mo.  The patient/his spouse voiced understanding and agreement to the plan.  [place], DO 05/14/22 9:51 AM

## 2022-05-22 ENCOUNTER — Other Ambulatory Visit (HOSPITAL_BASED_OUTPATIENT_CLINIC_OR_DEPARTMENT_OTHER): Payer: Self-pay

## 2022-05-27 ENCOUNTER — Other Ambulatory Visit (HOSPITAL_BASED_OUTPATIENT_CLINIC_OR_DEPARTMENT_OTHER): Payer: Self-pay

## 2022-05-27 ENCOUNTER — Ambulatory Visit (INDEPENDENT_AMBULATORY_CARE_PROVIDER_SITE_OTHER): Payer: Medicare Other | Admitting: Family Medicine

## 2022-05-27 ENCOUNTER — Encounter: Payer: Self-pay | Admitting: Family Medicine

## 2022-05-27 VITALS — BP 136/62 | HR 76 | Temp 98.9°F | Ht 66.0 in | Wt 145.0 lb

## 2022-05-27 DIAGNOSIS — U071 COVID-19: Secondary | ICD-10-CM

## 2022-05-27 LAB — POCT INFLUENZA A/B
Influenza A, POC: NEGATIVE
Influenza B, POC: NEGATIVE

## 2022-05-27 LAB — POC COVID19 BINAXNOW: SARS Coronavirus 2 Ag: POSITIVE — AB

## 2022-05-27 MED ORDER — NIRMATRELVIR/RITONAVIR (PAXLOVID)TABLET
3.0000 | ORAL_TABLET | Freq: Two times a day (BID) | ORAL | 0 refills | Status: AC
Start: 2022-05-27 — End: 2022-06-01
  Filled 2022-05-27: qty 30, 5d supply, fill #0

## 2022-05-27 MED ORDER — BENZONATATE 200 MG PO CAPS
200.0000 mg | ORAL_CAPSULE | Freq: Two times a day (BID) | ORAL | 0 refills | Status: DC | PRN
Start: 2022-05-27 — End: 2022-11-18
  Filled 2022-05-27: qty 20, 10d supply, fill #0

## 2022-05-27 NOTE — Addendum Note (Signed)
Addended by: Sharon Seller B on: 05/27/2022 02:40 PM   Modules accepted: Orders

## 2022-05-27 NOTE — Progress Notes (Signed)
Chief Complaint  Patient presents with   Cough    Lelon Huh here for URI complaints. Here w spouse who helps w hx/interpreting ([place]).   Duration: 1 day  Associated symptoms: coughing, runny nose fatigue Denies: sinus congestion, sinus pain, itchy watery eyes, ear pain, ear drainage, sore throat, wheezing, shortness of breath, myalgia, and fevers Treatment to date: Tylenol, Dayquil Sick contacts: Yes; sick contacts at church  Past Medical History:  Diagnosis Date   Coronary artery disease    Depression    Diabetes mellitus without complication    GERD (gastroesophageal reflux disease)    Hyperlipidemia    Hypertension    Stroke     Objective BP 136/62 (BP Location: Left Arm, Cuff Size: Normal)   Pulse 76   Temp 98.9 F (37.2 C) (Oral)   Ht 5\' 6"  (1.676 m)   Wt 145 lb (65.8 kg)   SpO2 97%   BMI 23.40 kg/m  General: Awake, alert, appears stated age HEENT: AT, [place], ears patent b/l and TM's neg, nares patent w/o discharge, pharynx pink and without exudates, MMM Neck: No masses or asymmetry Heart: RRR Lungs: CTAB, no accessory muscle use Psych: Age appropriate judgment and insight, normal mood and affect  COVID-19 - Plan: benzonatate (TESSALON) 200 MG capsule, nirmatrelvir/ritonavir (PAXLOVID) 20 x 150 MG & 10 x 100MG  TABS  Continue to push fluids, practice good hand hygiene, cover mouth when coughing.  Discussed CDC quarantining guidelines. F/u prn. If starting to experience irreplaceable fluid loss, shaking, or shortness of breath, seek immediate care. Pt thru his wife voiced understanding and agreement to the plan.  [place], DO 05/27/22 2:28 PM

## 2022-05-27 NOTE — Patient Instructions (Addendum)
Continue to push fluids, practice good hand hygiene, and cover your mouth if you cough.  If you start having fevers, shaking or shortness of breath, seek immediate care.  OK to take Tylenol 1000 mg (2 extra strength tabs) or 975 mg (3 regular strength tabs) every 6 hours as needed.  For the first 5 days you should quarantine. Day 6-10 you can return to society with a mask as long as you are fever-free and have improving or resolved symptoms. Day 11 and beyond you can return maskless as long as you are fever-free with improved/resolved symptoms.   Let us know if you need anything.  

## 2022-06-19 ENCOUNTER — Encounter: Payer: Medicare Other | Admitting: Gastroenterology

## 2022-06-20 ENCOUNTER — Other Ambulatory Visit: Payer: Self-pay | Admitting: Family Medicine

## 2022-06-20 ENCOUNTER — Other Ambulatory Visit (HOSPITAL_BASED_OUTPATIENT_CLINIC_OR_DEPARTMENT_OTHER): Payer: Self-pay

## 2022-06-20 MED ORDER — ATORVASTATIN CALCIUM 80 MG PO TABS
80.0000 mg | ORAL_TABLET | Freq: Every day | ORAL | 3 refills | Status: DC
  Filled 2022-06-20: qty 90, 90d supply, fill #0
  Filled 2022-09-29: qty 90, 90d supply, fill #1
  Filled 2022-12-23: qty 90, 90d supply, fill #2
  Filled 2023-03-23 – 2023-04-07 (×2): qty 90, 90d supply, fill #3

## 2022-07-29 ENCOUNTER — Other Ambulatory Visit: Payer: Self-pay | Admitting: Family Medicine

## 2022-07-29 ENCOUNTER — Other Ambulatory Visit (HOSPITAL_BASED_OUTPATIENT_CLINIC_OR_DEPARTMENT_OTHER): Payer: Self-pay

## 2022-07-29 MED ORDER — METOPROLOL SUCCINATE ER 50 MG PO TB24
50.0000 mg | ORAL_TABLET | Freq: Two times a day (BID) | ORAL | 1 refills | Status: DC
  Filled 2022-07-29: qty 180, 90d supply, fill #0
  Filled 2022-10-13: qty 180, 90d supply, fill #1

## 2022-07-29 MED ORDER — METFORMIN HCL 500 MG PO TABS
500.0000 mg | ORAL_TABLET | Freq: Two times a day (BID) | ORAL | 0 refills | Status: DC
  Filled 2022-07-29: qty 180, 90d supply, fill #0

## 2022-08-05 ENCOUNTER — Encounter: Payer: Medicare Other | Admitting: Gastroenterology

## 2022-08-18 ENCOUNTER — Other Ambulatory Visit: Payer: Self-pay | Admitting: Family Medicine

## 2022-08-18 ENCOUNTER — Other Ambulatory Visit (HOSPITAL_BASED_OUTPATIENT_CLINIC_OR_DEPARTMENT_OTHER): Payer: Self-pay

## 2022-08-18 ENCOUNTER — Other Ambulatory Visit: Payer: Self-pay

## 2022-08-18 MED ORDER — PANTOPRAZOLE SODIUM 40 MG PO TBEC
40.0000 mg | DELAYED_RELEASE_TABLET | Freq: Every day | ORAL | 1 refills | Status: DC
  Filled 2022-08-18: qty 90, 90d supply, fill #0
  Filled 2022-11-17: qty 90, 90d supply, fill #1

## 2022-08-25 ENCOUNTER — Other Ambulatory Visit (HOSPITAL_BASED_OUTPATIENT_CLINIC_OR_DEPARTMENT_OTHER): Payer: Self-pay

## 2022-09-24 DIAGNOSIS — H903 Sensorineural hearing loss, bilateral: Secondary | ICD-10-CM | POA: Diagnosis not present

## 2022-09-29 ENCOUNTER — Other Ambulatory Visit: Payer: Self-pay | Admitting: Cardiovascular Disease

## 2022-09-29 ENCOUNTER — Other Ambulatory Visit: Payer: Self-pay | Admitting: Family Medicine

## 2022-09-29 ENCOUNTER — Other Ambulatory Visit (HOSPITAL_BASED_OUTPATIENT_CLINIC_OR_DEPARTMENT_OTHER): Payer: Self-pay

## 2022-09-29 DIAGNOSIS — R413 Other amnesia: Secondary | ICD-10-CM

## 2022-09-29 DIAGNOSIS — R053 Chronic cough: Secondary | ICD-10-CM

## 2022-09-29 MED ORDER — DONEPEZIL HCL 5 MG PO TABS
5.0000 mg | ORAL_TABLET | Freq: Every day | ORAL | 0 refills | Status: DC
Start: 2022-09-29 — End: 2022-12-08
  Filled 2022-09-29 – 2022-10-13 (×2): qty 90, 90d supply, fill #0

## 2022-09-29 MED ORDER — CLOPIDOGREL BISULFATE 75 MG PO TABS
75.0000 mg | ORAL_TABLET | Freq: Every day | ORAL | 1 refills | Status: DC
  Filled 2022-09-29 – 2022-10-13 (×2): qty 90, 90d supply, fill #0
  Filled 2022-12-08 – 2022-12-22 (×2): qty 90, 90d supply, fill #1

## 2022-09-29 MED ORDER — OLMESARTAN MEDOXOMIL 20 MG PO TABS
20.0000 mg | ORAL_TABLET | Freq: Every day | ORAL | 2 refills | Status: DC
Start: 2022-09-29 — End: 2023-07-13
  Filled 2022-09-29: qty 90, 90d supply, fill #0
  Filled 2022-10-13: qty 15, 15d supply, fill #0
  Filled 2022-10-13: qty 90, 90d supply, fill #0
  Filled 2022-10-13: qty 75, 75d supply, fill #0
  Filled 2023-01-19: qty 90, 90d supply, fill #1
  Filled 2023-04-07: qty 90, 90d supply, fill #2

## 2022-10-04 DIAGNOSIS — U071 COVID-19: Secondary | ICD-10-CM | POA: Diagnosis not present

## 2022-10-04 DIAGNOSIS — R059 Cough, unspecified: Secondary | ICD-10-CM | POA: Diagnosis not present

## 2022-10-13 ENCOUNTER — Other Ambulatory Visit: Payer: Self-pay | Admitting: Family Medicine

## 2022-10-13 ENCOUNTER — Other Ambulatory Visit (HOSPITAL_BASED_OUTPATIENT_CLINIC_OR_DEPARTMENT_OTHER): Payer: Self-pay

## 2022-10-13 MED ORDER — METFORMIN HCL 500 MG PO TABS
500.0000 mg | ORAL_TABLET | Freq: Two times a day (BID) | ORAL | 0 refills | Status: DC
  Filled 2022-10-13 – 2022-11-12 (×3): qty 180, 90d supply, fill #0

## 2022-10-28 ENCOUNTER — Other Ambulatory Visit (HOSPITAL_BASED_OUTPATIENT_CLINIC_OR_DEPARTMENT_OTHER): Payer: Self-pay

## 2022-10-29 ENCOUNTER — Other Ambulatory Visit (HOSPITAL_BASED_OUTPATIENT_CLINIC_OR_DEPARTMENT_OTHER): Payer: Self-pay

## 2022-10-29 ENCOUNTER — Other Ambulatory Visit: Payer: Self-pay

## 2022-11-11 ENCOUNTER — Other Ambulatory Visit (HOSPITAL_BASED_OUTPATIENT_CLINIC_OR_DEPARTMENT_OTHER): Payer: Self-pay

## 2022-11-12 ENCOUNTER — Other Ambulatory Visit (HOSPITAL_BASED_OUTPATIENT_CLINIC_OR_DEPARTMENT_OTHER): Payer: Self-pay

## 2022-11-17 ENCOUNTER — Other Ambulatory Visit (HOSPITAL_BASED_OUTPATIENT_CLINIC_OR_DEPARTMENT_OTHER): Payer: Self-pay

## 2022-11-17 ENCOUNTER — Other Ambulatory Visit: Payer: Self-pay

## 2022-11-17 ENCOUNTER — Other Ambulatory Visit: Payer: Self-pay | Admitting: Family Medicine

## 2022-11-17 MED ORDER — MIRTAZAPINE 30 MG PO TABS
30.0000 mg | ORAL_TABLET | Freq: Every day | ORAL | 2 refills | Status: DC
  Filled 2022-11-17 – 2022-11-20 (×4): qty 90, 90d supply, fill #0
  Filled 2023-02-16: qty 90, 90d supply, fill #1
  Filled 2023-04-07 – 2023-04-27 (×2): qty 90, 90d supply, fill #2

## 2022-11-18 ENCOUNTER — Ambulatory Visit: Payer: Medicare Other | Admitting: Family Medicine

## 2022-11-18 ENCOUNTER — Encounter: Payer: Self-pay | Admitting: Family Medicine

## 2022-11-18 ENCOUNTER — Other Ambulatory Visit: Payer: Self-pay | Admitting: Family Medicine

## 2022-11-18 ENCOUNTER — Other Ambulatory Visit (HOSPITAL_BASED_OUTPATIENT_CLINIC_OR_DEPARTMENT_OTHER): Payer: Self-pay

## 2022-11-18 VITALS — BP 128/72 | HR 48 | Temp 98.0°F | Ht 66.0 in | Wt 147.0 lb

## 2022-11-18 DIAGNOSIS — E119 Type 2 diabetes mellitus without complications: Secondary | ICD-10-CM | POA: Diagnosis not present

## 2022-11-18 DIAGNOSIS — Z23 Encounter for immunization: Secondary | ICD-10-CM

## 2022-11-18 DIAGNOSIS — I10 Essential (primary) hypertension: Secondary | ICD-10-CM | POA: Diagnosis not present

## 2022-11-18 DIAGNOSIS — E782 Mixed hyperlipidemia: Secondary | ICD-10-CM

## 2022-11-18 LAB — COMPREHENSIVE METABOLIC PANEL
ALT: 13 U/L (ref 0–53)
AST: 19 U/L (ref 0–37)
Albumin: 3.9 g/dL (ref 3.5–5.2)
Alkaline Phosphatase: 75 U/L (ref 39–117)
BUN: 11 mg/dL (ref 6–23)
CO2: 31 mEq/L (ref 19–32)
Calcium: 9.1 mg/dL (ref 8.4–10.5)
Chloride: 103 mEq/L (ref 96–112)
Creatinine, Ser: 0.9 mg/dL (ref 0.40–1.50)
GFR: 81.45 mL/min (ref >60.00)
Glucose, Bld: 112 mg/dL — ABNORMAL HIGH (ref 70–99)
Potassium: 4.5 mEq/L (ref 3.5–5.1)
Sodium: 140 mEq/L (ref 135–145)
Total Bilirubin: 1 mg/dL (ref 0.2–1.2)
Total Protein: 7 g/dL (ref 6.0–8.3)

## 2022-11-18 LAB — LIPID PANEL
Cholesterol: 94 mg/dL (ref 0–200)
HDL: 40.1 mg/dL (ref >39.00)
LDL Cholesterol: 26 mg/dL (ref 0–99)
NonHDL: 53.55
Total CHOL/HDL Ratio: 2
Triglycerides: 136 mg/dL (ref 0.0–149.0)
VLDL: 27.2 mg/dL (ref 0.0–40.0)

## 2022-11-18 LAB — CBC
HCT: 46.2 % (ref 39.0–52.0)
Hemoglobin: 15.2 g/dL (ref 13.0–17.0)
MCHC: 33 g/dL (ref 30.0–36.0)
MCV: 92.6 fl (ref 78.0–100.0)
Platelets: 246 10*3/uL (ref 150.0–400.0)
RBC: 4.99 Mil/uL (ref 4.22–5.81)
RDW: 14.5 % (ref 11.5–15.5)
WBC: 7.3 10*3/uL (ref 4.0–10.5)

## 2022-11-18 LAB — HEMOGLOBIN A1C: Hgb A1c MFr Bld: 6.8 % — ABNORMAL HIGH (ref 4.6–6.5)

## 2022-11-18 MED ORDER — FAMOTIDINE 20 MG PO TABS
20.0000 mg | ORAL_TABLET | Freq: Two times a day (BID) | ORAL | 2 refills | Status: DC
  Filled 2022-11-18: qty 60, 30d supply, fill #0
  Filled 2022-12-11 – 2022-12-23 (×2): qty 60, 30d supply, fill #1
  Filled 2023-01-16: qty 60, 30d supply, fill #2

## 2022-11-18 NOTE — Addendum Note (Signed)
Addended by: Scharlene Gloss B on: 11/18/2022 11:41 AM   Modules accepted: Orders

## 2022-11-18 NOTE — Patient Instructions (Addendum)
Give Bryan Henderson 2-3 business days to get the results of your labs back.   Keep the diet clean and stay active.  Aim to do some physical exertion for 150 minutes per week. This is typically divided into 5 days per week, 30 minutes per day. The activity should be enough to get your heart rate up. Anything is better than nothing if you have time constraints.  Please consider adding some weight resistance exercise to your routine. Consider yoga as well.   Let Bryan Henderson know if you need anything.

## 2022-11-18 NOTE — Progress Notes (Signed)
Subjective:   Chief Complaint  Patient presents with   Follow-up    Bryan Henderson is a 77 y.o. male here for follow-up of diabetes. Here w spouse who helps interpret [place]).  Raza does not routinely check his sugars.  Patient does not require insulin.   Medications include: metformin 500 mg bid Diet is healthy.  Exercise: walking  Hypertension Patient presents for hypertension follow up. He does monitor home blood pressures. Blood pressures ranging on average from 140's/70's. He is compliant with medications- olmesartan 20 mg/d, Toprol XL 50 mg bid. Patient has these side effects of medication: none Diet/exercise as above. No CP or SOB.  Hyperlipidemia Patient presents for dyslipidemia follow up. Currently being treated with Lipitor 80 mg/d and compliance with treatment thus far has been good. He denies myalgias. Diet/exercise as above.  The patient is known to have coexisting coronary artery disease.   Past Medical History:  Diagnosis Date   Coronary artery disease    Depression    Diabetes mellitus without complication (HCC)    GERD (gastroesophageal reflux disease)    Hyperlipidemia    Hypertension    Stroke (HCC)      Related testing: Retinal exam: Done Pneumovax: done  Objective:  BP (!) 142/68 (BP Location: Left Arm, Patient Position: Sitting, Cuff Size: Normal)   Pulse (!) 48   Temp 98 F (36.7 C) (Oral)   Ht 5\' 6"  (1.676 m)   Wt 147 lb (66.7 kg)   SpO2 98%   BMI 23.73 kg/m  General:  Well developed, well nourished, in no apparent distress Lungs:  CTAB, no access msc use Cardio:  RRR, no bruits, no LE edema Psych: Age appropriate judgment and insight  Assessment:   Diabetes mellitus type 2, diet-controlled (HCC) - Plan: CBC, Comprehensive metabolic panel, Lipid panel, Hemoglobin A1c  Essential hypertension  Mixed hyperlipidemia   Plan:   Chronic, stable. Cont diet controlled. Counseled on diet and exercise. Chronic, stable. Cont  olmesartan 20 mg/d, Toprol XL 50 mg bid.  Chronic, stable. Cont Lipitor 80 mg/d. F/u in 6 mo. The patient and his spouse voiced understanding and agreement to the plan.  Jilda Roche [place], DO 11/18/22 11:15 AM

## 2022-11-19 ENCOUNTER — Other Ambulatory Visit (HOSPITAL_BASED_OUTPATIENT_CLINIC_OR_DEPARTMENT_OTHER): Payer: Self-pay

## 2022-11-19 MED ORDER — COMIRNATY 30 MCG/0.3ML IM SUSY
PREFILLED_SYRINGE | INTRAMUSCULAR | 0 refills | Status: DC
  Filled 2022-11-19: qty 0.3, 1d supply, fill #0

## 2022-11-20 ENCOUNTER — Other Ambulatory Visit (HOSPITAL_BASED_OUTPATIENT_CLINIC_OR_DEPARTMENT_OTHER): Payer: Self-pay

## 2022-11-25 ENCOUNTER — Other Ambulatory Visit (HOSPITAL_BASED_OUTPATIENT_CLINIC_OR_DEPARTMENT_OTHER): Payer: Self-pay

## 2022-12-08 ENCOUNTER — Other Ambulatory Visit: Payer: Self-pay | Admitting: Family Medicine

## 2022-12-08 ENCOUNTER — Other Ambulatory Visit (HOSPITAL_BASED_OUTPATIENT_CLINIC_OR_DEPARTMENT_OTHER): Payer: Self-pay

## 2022-12-08 ENCOUNTER — Other Ambulatory Visit (HOSPITAL_COMMUNITY): Payer: Self-pay

## 2022-12-08 DIAGNOSIS — R413 Other amnesia: Secondary | ICD-10-CM

## 2022-12-08 MED ORDER — DONEPEZIL HCL 5 MG PO TABS
5.0000 mg | ORAL_TABLET | Freq: Every day | ORAL | 0 refills | Status: DC
  Filled 2022-12-08 – 2023-01-19 (×3): qty 90, 90d supply, fill #0

## 2022-12-10 ENCOUNTER — Other Ambulatory Visit (HOSPITAL_BASED_OUTPATIENT_CLINIC_OR_DEPARTMENT_OTHER): Payer: Self-pay

## 2022-12-22 ENCOUNTER — Other Ambulatory Visit (HOSPITAL_BASED_OUTPATIENT_CLINIC_OR_DEPARTMENT_OTHER): Payer: Self-pay

## 2022-12-23 ENCOUNTER — Other Ambulatory Visit (HOSPITAL_BASED_OUTPATIENT_CLINIC_OR_DEPARTMENT_OTHER): Payer: Self-pay

## 2023-01-19 ENCOUNTER — Other Ambulatory Visit (HOSPITAL_BASED_OUTPATIENT_CLINIC_OR_DEPARTMENT_OTHER): Payer: Self-pay

## 2023-01-19 ENCOUNTER — Other Ambulatory Visit: Payer: Self-pay

## 2023-01-20 ENCOUNTER — Other Ambulatory Visit: Payer: Self-pay

## 2023-01-20 ENCOUNTER — Other Ambulatory Visit (HOSPITAL_BASED_OUTPATIENT_CLINIC_OR_DEPARTMENT_OTHER): Payer: Self-pay

## 2023-01-20 ENCOUNTER — Other Ambulatory Visit: Payer: Self-pay | Admitting: Family Medicine

## 2023-01-20 MED ORDER — METOPROLOL SUCCINATE ER 50 MG PO TB24
50.0000 mg | ORAL_TABLET | Freq: Two times a day (BID) | ORAL | 1 refills | Status: DC
  Filled 2023-01-20: qty 180, 90d supply, fill #0
  Filled 2023-04-07: qty 180, 90d supply, fill #1

## 2023-02-04 ENCOUNTER — Encounter: Payer: Self-pay | Admitting: Cardiovascular Disease

## 2023-02-04 ENCOUNTER — Ambulatory Visit: Payer: Medicare Other | Attending: Cardiovascular Disease | Admitting: Cardiovascular Disease

## 2023-02-04 VITALS — BP 138/62 | HR 51 | Ht 66.0 in | Wt 146.2 lb

## 2023-02-04 DIAGNOSIS — I214 Non-ST elevation (NSTEMI) myocardial infarction: Secondary | ICD-10-CM

## 2023-02-04 DIAGNOSIS — E782 Mixed hyperlipidemia: Secondary | ICD-10-CM | POA: Diagnosis not present

## 2023-02-04 DIAGNOSIS — I10 Essential (primary) hypertension: Secondary | ICD-10-CM

## 2023-02-04 NOTE — Assessment & Plan Note (Signed)
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  LV function was normal.  Subsequent Myoview performed 06/25/2020 was nonischemic.  The patient is asymptomatic and remains on dual antiplatelet therapy.

## 2023-02-04 NOTE — Assessment & Plan Note (Signed)
History of essential hypertension her blood pressure measured today at 138/62.  He is on metoprolol and Benicar.

## 2023-02-04 NOTE — Progress Notes (Signed)
02/04/2023 Bryan Henderson   February 16, 1946  [id number]  Primary Physician Bryan Henderson, Bryan Roche, DO Primary Cardiologist: Bryan Gess MD Bryan Henderson, [place]  HPI:  Bryan Henderson is a 77 y.o.   thin-appearing married [place] male father of 2 sons, grandfather of 4 grandchildren who was transferring care from Dr. Norman Henderson to myself because of proximity.  He is accompanied by his wife Bryan Henderson.  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  He relocated from [place] to [facility] October 2019.  I last saw him in the office 01/28/2022.  He smoked cigarettes and drink alcohol remotely and stopped in 1986.  He has treated hypertension, diabetes and hyperlipidemia.  He had a non-STEMI 03/15/2018 and underwent diagnostic coronary angiography by Dr. Eldridge Henderson revealing a high-grade mid AV groove circumflex which was stented successfully by 3 mm x 24 mm long Synergy drug-eluting stent.  His LV function was normal.  He had a subsequent negative Myoview stress test performed 06/25/2020.   Since I saw him a year ago he is remained stable.  He denies chest pain or shortness of breath.  He recently returned with his wife having spent a week in [place] visiting his son who works for [employer] and his daughter-in-law who is a Clinical research associate.  They recently adopted a child from [place] who is currently 79 years old.  Current Meds  Medication Sig   atorvastatin (LIPITOR) 80 MG tablet Take 1 tablet (80 mg total) by mouth daily.   clopidogrel (PLAVIX) 75 MG tablet Take 1 tablet (75 mg total) by mouth daily.   COVID-19 mRNA vaccine, Pfizer, (COMIRNATY) syringe Inject into the muscle.   donepezil (ARICEPT) 5 MG tablet Take 1 tablet (5 mg total) by mouth at bedtime.   famotidine (PEPCID) 20 MG tablet Take 1 tablet (20 mg total) by mouth 2 (two) times daily.   metFORMIN (GLUCOPHAGE) 500 MG tablet Take 1 tablet (500 mg total) by mouth 2 (two) times daily with a meal.   metoprolol succinate  (TOPROL-XL) 50 MG 24 hr tablet Take 1 tablet (50 mg total) by mouth 2 (two) times daily.   mirtazapine (REMERON) 30 MG tablet Take 1 tablet (30 mg total) by mouth at bedtime.   Multiple Vitamin (MULTIVITAMIN) tablet Take 1 tablet by mouth daily.   nitroGLYCERIN (NITROSTAT) 0.4 MG SL tablet Place 1 tablet (0.4 mg total) under the tongue every 5 (five) minutes as needed for chest pain.   olmesartan (BENICAR) 20 MG tablet Take 1 tablet (20 mg total) by mouth daily.   pantoprazole (PROTONIX) 40 MG tablet Take 1 tablet (40 mg total) by mouth daily.     No Known Allergies  Social History   Socioeconomic History   Marital status: Married    Spouse name: Bryan Henderson   Number of children: 2   Years of education: Not on file   Highest education level: Not on file  Occupational History   Not on file  Tobacco Use   Smoking status: Former    Types: Cigarettes   Smokeless tobacco: Never   Tobacco comments:    quit in 1986  Vaping Use   Vaping status: Never Used  Substance and Sexual Activity   Alcohol use: Not Currently   Drug use: Never   Sexual activity: Not on file  Other Topics Concern   Not on file  Social History Narrative   Not on file   Social Determinants of  Health   Financial Resource Strain: Not on file  Food Insecurity: Not on file  Transportation Needs: Not on file  Physical Activity: Not on file  Stress: Not on file  Social Connections: Not on file  Intimate Partner Violence: Unknown (01/15/2019)   [facility] from [facility], [facility]   Intimate Partner Violence    Within the last year, have you been afraid of your partner, ex-partner or family member?: Not on file    Within the last year, have you been humiliated or emotionally abused in other ways by your partner, ex-partner or family member?: Not on file    Within the last year, have you been kicked, hit, slapped, or otherwise physically hurt by your partner, ex-partner or family member?: Not  on file    Within the last year, have you been raped or forced to have any kind of sexual activity by your partner, ex-partner or family member?: Not on file     Review of Systems: General: negative for chills, fever, night sweats or weight changes.  Cardiovascular: negative for chest pain, dyspnea on exertion, edema, orthopnea, palpitations, paroxysmal nocturnal dyspnea or shortness of breath Dermatological: negative for rash Respiratory: negative for cough or wheezing Urologic: negative for hematuria Abdominal: negative for nausea, vomiting, diarrhea, bright red blood per rectum, melena, or hematemesis Neurologic: negative for visual changes, syncope, or dizziness All other systems reviewed and are otherwise negative except as noted above.    Blood pressure 138/62, pulse (!) 51, height 5\' 6"  (1.676 m), weight 146 lb 3.2 oz (66.3 kg), SpO2 98%.  General appearance: alert and no distress Neck: no adenopathy, no carotid bruit, no JVD, supple, symmetrical, trachea midline, and thyroid not enlarged, symmetric, no tenderness/mass/nodules Lungs: clear to auscultation bilaterally Heart: regular rate and rhythm, S1, S2 normal, no murmur, click, rub or gallop Extremities: extremities normal, atraumatic, no cyanosis or edema Pulses: 2+ and symmetric Skin: Skin color, texture, turgor normal. No rashes or lesions Neurologic: Grossly normal  EKG EKG Interpretation Date/Time:  Wednesday February 04 2023 09:15:58 EST Ventricular Rate:  51 PR Interval:  168 QRS Duration:  76 QT Interval:  462 QTC Calculation: 425 R Axis:   -1  Text Interpretation: Sinus bradycardia Cannot rule out Inferior infarct , age undetermined When compared with ECG of 24-Mar-2018 03:27, Minimal criteria for Inferior infarct are now Present ST now depressed in Inferior leads T wave inversion now evident in Inferior leads Confirmed by Bryan Henderson [id number]) on 02/04/2023 9:16:27 AM    ASSESSMENT AND PLAN:   NSTEMI  (non-ST elevated myocardial infarction) [facility]) 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  LV function was normal.  Subsequent Myoview performed 06/25/2020 was nonischemic.  The patient is asymptomatic and remains on dual antiplatelet therapy.  Essential hypertension History of essential hypertension her blood pressure measured today at 138/62.  He is on metoprolol and Benicar.  Hyperlipidemia History of hyperlipidemia on statin therapy with lipid profile performed 11/14/2022 revealing a total cholesterol of 94, LDL 26 and HDL 40.     Bryan Gess MD FACP,FACC,FAHA, [facility] 02/04/2023 9:29 AM

## 2023-02-04 NOTE — Patient Instructions (Signed)

## 2023-02-04 NOTE — Assessment & Plan Note (Signed)
History of hyperlipidemia on statin therapy with lipid profile performed 11/14/2022 revealing a total cholesterol of 94, LDL 26 and HDL 40.

## 2023-02-09 ENCOUNTER — Other Ambulatory Visit: Payer: Self-pay | Admitting: Family Medicine

## 2023-02-09 ENCOUNTER — Other Ambulatory Visit (HOSPITAL_BASED_OUTPATIENT_CLINIC_OR_DEPARTMENT_OTHER): Payer: Self-pay

## 2023-02-09 MED ORDER — METFORMIN HCL 500 MG PO TABS
500.0000 mg | ORAL_TABLET | Freq: Two times a day (BID) | ORAL | 0 refills | Status: DC
  Filled 2023-02-09: qty 180, 90d supply, fill #0

## 2023-02-16 ENCOUNTER — Other Ambulatory Visit (HOSPITAL_BASED_OUTPATIENT_CLINIC_OR_DEPARTMENT_OTHER): Payer: Self-pay

## 2023-02-16 ENCOUNTER — Other Ambulatory Visit: Payer: Self-pay

## 2023-02-16 ENCOUNTER — Other Ambulatory Visit: Payer: Self-pay | Admitting: Family Medicine

## 2023-02-16 MED ORDER — FAMOTIDINE 20 MG PO TABS
20.0000 mg | ORAL_TABLET | Freq: Two times a day (BID) | ORAL | 2 refills | Status: DC
  Filled 2023-02-16: qty 60, 30d supply, fill #0
  Filled 2023-03-17 – 2023-04-07 (×2): qty 60, 30d supply, fill #1
  Filled 2023-11-10: qty 60, 30d supply, fill #2

## 2023-02-16 MED ORDER — PANTOPRAZOLE SODIUM 40 MG PO TBEC
40.0000 mg | DELAYED_RELEASE_TABLET | Freq: Every day | ORAL | 1 refills | Status: DC
  Filled 2023-02-16: qty 90, 90d supply, fill #0
  Filled 2023-04-07 – 2023-04-27 (×2): qty 90, 90d supply, fill #1

## 2023-03-11 DIAGNOSIS — E119 Type 2 diabetes mellitus without complications: Secondary | ICD-10-CM | POA: Diagnosis not present

## 2023-03-11 DIAGNOSIS — Z7984 Long term (current) use of oral hypoglycemic drugs: Secondary | ICD-10-CM | POA: Diagnosis not present

## 2023-03-11 DIAGNOSIS — H524 Presbyopia: Secondary | ICD-10-CM | POA: Diagnosis not present

## 2023-03-11 DIAGNOSIS — H04123 Dry eye syndrome of bilateral lacrimal glands: Secondary | ICD-10-CM | POA: Diagnosis not present

## 2023-03-11 DIAGNOSIS — H2513 Age-related nuclear cataract, bilateral: Secondary | ICD-10-CM | POA: Diagnosis not present

## 2023-03-11 DIAGNOSIS — H52203 Unspecified astigmatism, bilateral: Secondary | ICD-10-CM | POA: Diagnosis not present

## 2023-03-11 DIAGNOSIS — H0100B Unspecified blepharitis left eye, upper and lower eyelids: Secondary | ICD-10-CM | POA: Diagnosis not present

## 2023-03-11 DIAGNOSIS — H43393 Other vitreous opacities, bilateral: Secondary | ICD-10-CM | POA: Diagnosis not present

## 2023-03-11 DIAGNOSIS — H35363 Drusen (degenerative) of macula, bilateral: Secondary | ICD-10-CM | POA: Diagnosis not present

## 2023-03-11 DIAGNOSIS — H353131 Nonexudative age-related macular degeneration, bilateral, early dry stage: Secondary | ICD-10-CM | POA: Diagnosis not present

## 2023-03-11 DIAGNOSIS — H5203 Hypermetropia, bilateral: Secondary | ICD-10-CM | POA: Diagnosis not present

## 2023-03-11 DIAGNOSIS — H0100A Unspecified blepharitis right eye, upper and lower eyelids: Secondary | ICD-10-CM | POA: Diagnosis not present

## 2023-03-11 DIAGNOSIS — H3554 Dystrophies primarily involving the retinal pigment epithelium: Secondary | ICD-10-CM | POA: Diagnosis not present

## 2023-03-11 DIAGNOSIS — H25013 Cortical age-related cataract, bilateral: Secondary | ICD-10-CM | POA: Diagnosis not present

## 2023-03-11 LAB — HM DIABETES EYE EXAM

## 2023-04-01 ENCOUNTER — Other Ambulatory Visit (HOSPITAL_BASED_OUTPATIENT_CLINIC_OR_DEPARTMENT_OTHER): Payer: Self-pay

## 2023-04-07 ENCOUNTER — Other Ambulatory Visit (HOSPITAL_BASED_OUTPATIENT_CLINIC_OR_DEPARTMENT_OTHER): Payer: Self-pay

## 2023-04-07 ENCOUNTER — Other Ambulatory Visit: Payer: Self-pay | Admitting: Family Medicine

## 2023-04-07 ENCOUNTER — Other Ambulatory Visit: Payer: Self-pay

## 2023-04-07 ENCOUNTER — Other Ambulatory Visit: Payer: Self-pay | Admitting: Cardiovascular Disease

## 2023-04-07 DIAGNOSIS — R413 Other amnesia: Secondary | ICD-10-CM

## 2023-04-07 MED ORDER — CLOPIDOGREL BISULFATE 75 MG PO TABS
75.0000 mg | ORAL_TABLET | Freq: Every day | ORAL | 3 refills | Status: DC
  Filled 2023-04-07: qty 90, 90d supply, fill #0
  Filled 2023-07-07: qty 90, 90d supply, fill #1
  Filled 2023-10-05: qty 90, 90d supply, fill #2
  Filled 2024-01-04: qty 90, 90d supply, fill #3

## 2023-04-07 MED ORDER — DONEPEZIL HCL 5 MG PO TABS
5.0000 mg | ORAL_TABLET | Freq: Every day | ORAL | 0 refills | Status: DC
  Filled 2023-04-07: qty 90, 90d supply, fill #0

## 2023-04-07 MED ORDER — METFORMIN HCL 500 MG PO TABS
500.0000 mg | ORAL_TABLET | Freq: Two times a day (BID) | ORAL | 0 refills | Status: DC
  Filled 2023-04-07 – 2023-05-14 (×2): qty 180, 90d supply, fill #0

## 2023-04-10 ENCOUNTER — Other Ambulatory Visit (HOSPITAL_BASED_OUTPATIENT_CLINIC_OR_DEPARTMENT_OTHER): Payer: Self-pay

## 2023-05-06 ENCOUNTER — Other Ambulatory Visit (HOSPITAL_BASED_OUTPATIENT_CLINIC_OR_DEPARTMENT_OTHER): Payer: Self-pay

## 2023-05-14 ENCOUNTER — Other Ambulatory Visit (HOSPITAL_BASED_OUTPATIENT_CLINIC_OR_DEPARTMENT_OTHER): Payer: Self-pay

## 2023-05-18 ENCOUNTER — Other Ambulatory Visit (HOSPITAL_BASED_OUTPATIENT_CLINIC_OR_DEPARTMENT_OTHER): Payer: Self-pay

## 2023-05-19 ENCOUNTER — Encounter: Payer: Self-pay | Admitting: Family Medicine

## 2023-05-19 ENCOUNTER — Ambulatory Visit (INDEPENDENT_AMBULATORY_CARE_PROVIDER_SITE_OTHER): Payer: Medicare Other | Admitting: Family Medicine

## 2023-05-19 VITALS — BP 130/72 | HR 60 | Ht 66.0 in | Wt 145.2 lb

## 2023-05-19 DIAGNOSIS — R1312 Dysphagia, oropharyngeal phase: Secondary | ICD-10-CM | POA: Diagnosis not present

## 2023-05-19 DIAGNOSIS — Z0000 Encounter for general adult medical examination without abnormal findings: Secondary | ICD-10-CM | POA: Diagnosis not present

## 2023-05-19 DIAGNOSIS — Z7984 Long term (current) use of oral hypoglycemic drugs: Secondary | ICD-10-CM

## 2023-05-19 DIAGNOSIS — E1165 Type 2 diabetes mellitus with hyperglycemia: Secondary | ICD-10-CM | POA: Diagnosis not present

## 2023-05-19 NOTE — Patient Instructions (Signed)
 Give [place] 2-3 business days to get the results of your labs back.   Keep the diet clean and stay active.  Please get me a copy of your advanced directive form at your convenience.   Let us know if you need anything.

## 2023-05-19 NOTE — Progress Notes (Signed)
 Chief Complaint  Patient presents with   Annual Exam    Patient presents today for a physical exam.    Well Male Bryan Henderson is here for a complete physical.  He is here with his wife who helps interpret ([place]). His last physical was >1 year ago.  Current diet: in general, a "healthy" diet.   Current exercise: walking Weight trend: stable Fatigue out of ordinary? No. Seat belt? Yes.   Advanced directive? No  Health maintenance Shingrix- Yes Colonoscopy- Yes Tetanus- Yes Hep C- Yes Pneumonia vaccine- Yes  Past Medical History:  Diagnosis Date   Coronary artery disease    Depression    Diabetes mellitus without complication (HCC)    GERD (gastroesophageal reflux disease)    Hyperlipidemia    Hypertension    Stroke [facility])      Past Surgical History:  Procedure Laterality Date   COLONOSCOPY WITH ESOPHAGOGASTRODUODENOSCOPY (EGD)  06/09/2017   [facility]. [place]   CORONARY STENT INTERVENTION N/A 03/23/2018   Procedure: CORONARY STENT INTERVENTION;  Surgeon: Corky Crafts, MD;  Location: [facility] INVASIVE CV LAB;  Service: Cardiovascular;  Laterality: N/A;   LEFT HEART CATH AND CORONARY ANGIOGRAPHY N/A 03/23/2018   Procedure: LEFT HEART CATH AND CORONARY ANGIOGRAPHY;  Surgeon: Corky Crafts, MD;  Location: [facility] INVASIVE CV LAB;  Service: Cardiovascular;  Laterality: N/A;    Medications  Current Outpatient Medications on File Prior to Visit  Medication Sig Dispense Refill   atorvastatin (LIPITOR) 80 MG tablet Take 1 tablet (80 mg total) by mouth daily. 90 tablet 3   clopidogrel (PLAVIX) 75 MG tablet Take 1 tablet (75 mg total) by mouth daily. 90 tablet 3   COVID-19 mRNA vaccine, Pfizer, (COMIRNATY) syringe Inject into the muscle. 0.3 mL 0   donepezil (ARICEPT) 5 MG tablet Take 1 tablet (5 mg total) by mouth at bedtime. 90 tablet 0   famotidine (PEPCID) 20 MG tablet Take 1 tablet (20 mg total) by mouth 2 (two) times daily. 60 tablet 2    metFORMIN (GLUCOPHAGE) 500 MG tablet Take 1 tablet (500 mg total) by mouth 2 (two) times daily with a meal. 180 tablet 0   metoprolol succinate (TOPROL-XL) 50 MG 24 hr tablet Take 1 tablet (50 mg total) by mouth 2 (two) times daily. 180 tablet 1   mirtazapine (REMERON) 30 MG tablet Take 1 tablet (30 mg total) by mouth at bedtime. 90 tablet 2   Multiple Vitamin (MULTIVITAMIN) tablet Take 1 tablet by mouth daily.     nitroGLYCERIN (NITROSTAT) 0.4 MG SL tablet Place 1 tablet (0.4 mg total) under the tongue every 5 (five) minutes as needed for chest pain. 25 tablet 0   olmesartan (BENICAR) 20 MG tablet Take 1 tablet (20 mg total) by mouth daily. 90 tablet 2   pantoprazole (PROTONIX) 40 MG tablet Take 1 tablet (40 mg total) by mouth daily. 90 tablet 1    Allergies No Known Allergies  Family History Family History  Problem Relation Age of Onset   Hypertension Brother    Heart attack Neg Hx    Heart disease Neg Hx     Review of Systems: Constitutional:  no fevers Eye:  no recent significant change in vision Ears:  No changes in hearing Nose/Mouth/Throat:  no complaints of nasal congestion, no sore throat Cardiovascular: no chest pain Respiratory:  No shortness of breath Gastrointestinal:  No change in bowel habits GU:  No frequency Integumentary:  no abnormal skin lesions reported Neurologic:  no headaches Endocrine:  denies unexplained weight changes  Exam BP 130/72   Pulse 60   Ht 5\' 6"  (1.676 m)   Wt 145 lb 3.2 oz (65.9 kg)   SpO2 97%   BMI 23.44 kg/m  General:  well developed, well nourished, in no apparent distress Skin:  no significant moles, warts, or growths Head:  no masses, lesions, or tenderness Eyes:  pupils equal and round, sclera anicteric without injection Ears:  canals without lesions, TMs shiny without retraction, no obvious effusion, no erythema Nose:  nares patent, mucosa normal Throat/Pharynx:  lips and gingiva without lesion; tongue and uvula midline;  non-inflamed pharynx; no exudates or postnasal drainage Lungs:  clear to auscultation, breath sounds equal bilaterally, no respiratory distress Cardio:  regular rate and rhythm, no LE edema or bruits Rectal: Deferred GI: BS+, S, NT, ND, no masses or organomegaly Musculoskeletal:  symmetrical muscle groups noted without atrophy or deformity Neuro:  gait slow; deep tendon reflexes normal and symmetric Psych: normal affect  Assessment and Plan  Well adult exam  Type 2 diabetes mellitus with hyperglycemia, without long-term current use of insulin (HCC) - Plan: CBC, Comprehensive metabolic panel, Lipid panel, Hemoglobin A1c, Microalbumin / creatinine urine ratio   Well 78 y.o. male. Counseled on diet and exercise. Advanced directive form provided today.  Continuing dysphagia, will refer to SLP for further eval.  Other orders as above. Follow up in 6 mo.  The patient and his wife voiced understanding and agreement to the plan.  Jilda Roche [place], DO 05/19/23 10:54 AM

## 2023-05-20 ENCOUNTER — Other Ambulatory Visit (INDEPENDENT_AMBULATORY_CARE_PROVIDER_SITE_OTHER)

## 2023-05-20 DIAGNOSIS — E1165 Type 2 diabetes mellitus with hyperglycemia: Secondary | ICD-10-CM | POA: Diagnosis not present

## 2023-05-20 LAB — HEMOGLOBIN A1C: Hgb A1c MFr Bld: 6.8 % — ABNORMAL HIGH (ref 4.6–6.5)

## 2023-05-20 LAB — MICROALBUMIN / CREATININE URINE RATIO
Creatinine,U: 117 mg/dL
Microalb Creat Ratio: 8.2 mg/g (ref 0.0–30.0)
Microalb, Ur: 1 mg/dL (ref 0.0–1.9)

## 2023-05-20 LAB — COMPREHENSIVE METABOLIC PANEL
ALT: 15 U/L (ref 0–53)
AST: 19 U/L (ref 0–37)
Albumin: 3.8 g/dL (ref 3.5–5.2)
Alkaline Phosphatase: 72 U/L (ref 39–117)
BUN: 12 mg/dL (ref 6–23)
CO2: 32 meq/L (ref 19–32)
Calcium: 9 mg/dL (ref 8.4–10.5)
Chloride: 104 meq/L (ref 96–112)
Creatinine, Ser: 0.9 mg/dL (ref 0.40–1.50)
GFR: 81.16 mL/min (ref >60.00)
Glucose, Bld: 122 mg/dL — ABNORMAL HIGH (ref 70–99)
Potassium: 4.7 meq/L (ref 3.5–5.1)
Sodium: 141 meq/L (ref 135–145)
Total Bilirubin: 0.8 mg/dL (ref 0.2–1.2)
Total Protein: 6.6 g/dL (ref 6.0–8.3)

## 2023-05-20 LAB — LIPID PANEL
Cholesterol: 91 mg/dL (ref 0–200)
HDL: 33.7 mg/dL — ABNORMAL LOW (ref >39.00)
LDL Cholesterol: 40 mg/dL (ref 0–99)
NonHDL: 57.63
Total CHOL/HDL Ratio: 3
Triglycerides: 86 mg/dL (ref 0.0–149.0)
VLDL: 17.2 mg/dL (ref 0.0–40.0)

## 2023-05-20 LAB — CBC
HCT: 44.2 % (ref 39.0–52.0)
Hemoglobin: 15 g/dL (ref 13.0–17.0)
MCHC: 34 g/dL (ref 30.0–36.0)
MCV: 92.8 fl (ref 78.0–100.0)
Platelets: 223 10*3/uL (ref 150.0–400.0)
RBC: 4.76 Mil/uL (ref 4.22–5.81)
RDW: 14.1 % (ref 11.5–15.5)
WBC: 7.6 10*3/uL (ref 4.0–10.5)

## 2023-05-20 NOTE — Addendum Note (Signed)
 Addended by: Kathi Simpers on: 05/20/2023 09:31 AM   Modules accepted: Orders

## 2023-06-02 ENCOUNTER — Ambulatory Visit: Admitting: Speech Pathology

## 2023-06-22 ENCOUNTER — Ambulatory Visit: Admitting: Speech Pathology

## 2023-07-03 ENCOUNTER — Other Ambulatory Visit (HOSPITAL_BASED_OUTPATIENT_CLINIC_OR_DEPARTMENT_OTHER): Payer: Self-pay

## 2023-07-03 ENCOUNTER — Other Ambulatory Visit: Payer: Self-pay | Admitting: Family Medicine

## 2023-07-03 MED ORDER — ATORVASTATIN CALCIUM 80 MG PO TABS
80.0000 mg | ORAL_TABLET | Freq: Every day | ORAL | 3 refills | Status: AC
  Filled 2023-07-03: qty 90, 90d supply, fill #0
  Filled 2023-10-09: qty 90, 90d supply, fill #1
  Filled 2024-01-04: qty 90, 90d supply, fill #2
  Filled 2024-03-25: qty 90, 90d supply, fill #3

## 2023-07-13 ENCOUNTER — Other Ambulatory Visit: Payer: Self-pay | Admitting: Family Medicine

## 2023-07-13 ENCOUNTER — Other Ambulatory Visit (HOSPITAL_BASED_OUTPATIENT_CLINIC_OR_DEPARTMENT_OTHER): Payer: Self-pay

## 2023-07-13 DIAGNOSIS — R413 Other amnesia: Secondary | ICD-10-CM

## 2023-07-13 DIAGNOSIS — R053 Chronic cough: Secondary | ICD-10-CM

## 2023-07-13 MED ORDER — DONEPEZIL HCL 5 MG PO TABS
5.0000 mg | ORAL_TABLET | Freq: Every day | ORAL | 0 refills | Status: DC
  Filled 2023-07-13: qty 90, 90d supply, fill #0

## 2023-07-13 MED ORDER — OLMESARTAN MEDOXOMIL 20 MG PO TABS
20.0000 mg | ORAL_TABLET | Freq: Every day | ORAL | 2 refills | Status: AC
  Filled 2023-07-13: qty 90, 90d supply, fill #0
  Filled 2023-10-09: qty 90, 90d supply, fill #1
  Filled 2024-01-04: qty 90, 90d supply, fill #2

## 2023-07-13 MED ORDER — METOPROLOL SUCCINATE ER 50 MG PO TB24
50.0000 mg | ORAL_TABLET | Freq: Two times a day (BID) | ORAL | 1 refills | Status: DC
  Filled 2023-07-13: qty 180, 90d supply, fill #0
  Filled 2023-10-12: qty 180, 90d supply, fill #1

## 2023-07-14 ENCOUNTER — Other Ambulatory Visit (HOSPITAL_BASED_OUTPATIENT_CLINIC_OR_DEPARTMENT_OTHER): Payer: Self-pay

## 2023-07-14 ENCOUNTER — Other Ambulatory Visit: Payer: Self-pay | Admitting: Family Medicine

## 2023-07-14 MED ORDER — MIRTAZAPINE 30 MG PO TABS
30.0000 mg | ORAL_TABLET | Freq: Every day | ORAL | 1 refills | Status: DC
  Filled 2023-07-14: qty 90, 90d supply, fill #0
  Filled 2023-11-09: qty 90, 90d supply, fill #1

## 2023-08-11 ENCOUNTER — Other Ambulatory Visit (HOSPITAL_BASED_OUTPATIENT_CLINIC_OR_DEPARTMENT_OTHER): Payer: Self-pay

## 2023-08-11 ENCOUNTER — Other Ambulatory Visit: Payer: Self-pay | Admitting: Family Medicine

## 2023-08-11 MED ORDER — PANTOPRAZOLE SODIUM 40 MG PO TBEC
40.0000 mg | DELAYED_RELEASE_TABLET | Freq: Every day | ORAL | 1 refills | Status: DC
  Filled 2023-08-11 – 2023-08-21 (×2): qty 90, 90d supply, fill #0
  Filled 2023-11-10: qty 90, 90d supply, fill #1

## 2023-08-11 MED ORDER — METFORMIN HCL 500 MG PO TABS
500.0000 mg | ORAL_TABLET | Freq: Two times a day (BID) | ORAL | 0 refills | Status: DC
  Filled 2023-08-11 – 2023-08-21 (×2): qty 180, 90d supply, fill #0

## 2023-08-19 ENCOUNTER — Other Ambulatory Visit (HOSPITAL_BASED_OUTPATIENT_CLINIC_OR_DEPARTMENT_OTHER): Payer: Self-pay

## 2023-08-21 ENCOUNTER — Other Ambulatory Visit (HOSPITAL_BASED_OUTPATIENT_CLINIC_OR_DEPARTMENT_OTHER): Payer: Self-pay

## 2023-10-09 ENCOUNTER — Other Ambulatory Visit: Payer: Self-pay | Admitting: Family Medicine

## 2023-10-09 ENCOUNTER — Other Ambulatory Visit (HOSPITAL_BASED_OUTPATIENT_CLINIC_OR_DEPARTMENT_OTHER): Payer: Self-pay

## 2023-10-09 DIAGNOSIS — R413 Other amnesia: Secondary | ICD-10-CM

## 2023-10-09 MED ORDER — DONEPEZIL HCL 5 MG PO TABS
5.0000 mg | ORAL_TABLET | Freq: Every day | ORAL | 0 refills | Status: DC
  Filled 2023-10-09: qty 90, 90d supply, fill #0

## 2023-11-10 ENCOUNTER — Other Ambulatory Visit (HOSPITAL_BASED_OUTPATIENT_CLINIC_OR_DEPARTMENT_OTHER): Payer: Self-pay

## 2023-11-12 ENCOUNTER — Other Ambulatory Visit (HOSPITAL_BASED_OUTPATIENT_CLINIC_OR_DEPARTMENT_OTHER): Payer: Self-pay

## 2023-11-12 ENCOUNTER — Other Ambulatory Visit: Payer: Self-pay | Admitting: Family Medicine

## 2023-11-12 MED ORDER — METFORMIN HCL 500 MG PO TABS
500.0000 mg | ORAL_TABLET | Freq: Two times a day (BID) | ORAL | 0 refills | Status: AC
Start: 2023-11-12 — End: ?
  Filled 2023-11-12: qty 180, 90d supply, fill #0

## 2023-11-17 ENCOUNTER — Ambulatory Visit: Payer: Self-pay | Admitting: Family Medicine

## 2023-11-17 ENCOUNTER — Ambulatory Visit: Admitting: Family Medicine

## 2023-11-17 ENCOUNTER — Encounter: Payer: Self-pay | Admitting: Family Medicine

## 2023-11-17 VITALS — BP 128/60 | HR 58 | Temp 98.0°F | Resp 18 | Ht 66.0 in | Wt 144.4 lb

## 2023-11-17 DIAGNOSIS — I10 Essential (primary) hypertension: Secondary | ICD-10-CM

## 2023-11-17 DIAGNOSIS — E1165 Type 2 diabetes mellitus with hyperglycemia: Secondary | ICD-10-CM

## 2023-11-17 LAB — COMPREHENSIVE METABOLIC PANEL WITH GFR
ALT: 14 U/L (ref 0–53)
AST: 17 U/L (ref 0–37)
Albumin: 3.9 g/dL (ref 3.5–5.2)
Alkaline Phosphatase: 81 U/L (ref 39–117)
BUN: 12 mg/dL (ref 6–23)
CO2: 30 meq/L (ref 19–32)
Calcium: 9.1 mg/dL (ref 8.4–10.5)
Chloride: 102 meq/L (ref 96–112)
Creatinine, Ser: 0.89 mg/dL (ref 0.40–1.50)
GFR: 81.16 mL/min (ref >60.00)
Glucose, Bld: 153 mg/dL — ABNORMAL HIGH (ref 70–99)
Potassium: 4.8 meq/L (ref 3.5–5.1)
Sodium: 138 meq/L (ref 135–145)
Total Bilirubin: 0.9 mg/dL (ref 0.2–1.2)
Total Protein: 6.5 g/dL (ref 6.0–8.3)

## 2023-11-17 LAB — LIPID PANEL
Cholesterol: 85 mg/dL (ref 0–200)
HDL: 33.8 mg/dL — ABNORMAL LOW (ref >39.00)
LDL Cholesterol: 21 mg/dL (ref 0–99)
NonHDL: 50.86
Total CHOL/HDL Ratio: 3
Triglycerides: 150 mg/dL — ABNORMAL HIGH (ref 0.0–149.0)
VLDL: 30 mg/dL (ref 0.0–40.0)

## 2023-11-17 LAB — HEMOGLOBIN A1C: Hgb A1c MFr Bld: 7.2 % — ABNORMAL HIGH (ref 4.6–6.5)

## 2023-11-17 NOTE — Progress Notes (Signed)
 Subjective:   Chief Complaint  Patient presents with   Follow-up    6 month    Bryan Henderson is a 78 y.o. male here for follow-up of diabetes. Here w wife who helps w hx ([place]).  [place] does not routinely monitor his sugars.  Patient does not require insulin.   Medications include: metformin  500 mg bid Diet is healthy.  Exercise: walking  Hypertension Patient presents for hypertension follow up. He does monitor home blood pressures. Blood pressures ranging on average from 120-130's/70's. He is compliant with medications- olmesartan  20 mg/d. Patient has these side effects of medication: none Diet/exercise as above.  No CP or SOB.   Past Medical History:  Diagnosis Date   Coronary artery disease    Depression    Diabetes mellitus without complication (HCC)    GERD (gastroesophageal reflux disease)    Hyperlipidemia    Hypertension    Stroke (HCC)      Related testing: Retinal exam: Done Pneumovax: done  Objective:  BP 128/60 (BP Location: Left Arm, Cuff Size: Normal)   Pulse (!) 58   Temp 98 F (36.7 C)   Resp 18   Ht 5' 6 (1.676 m)   Wt 144 lb 6.4 oz (65.5 kg)   SpO2 98%   BMI 23.31 kg/m  General:  Well developed, well nourished, in no apparent distress  Lungs:  CTAB, no access msc use Cardio:  Reg rhythm, bradycardic, no bruits, no LE edema Psych: Age appropriate judgment and insight  Assessment:   Type 2 diabetes mellitus with hyperglycemia, without long-term current use of insulin (HCC) - Plan: Lipid panel, Hemoglobin A1c, Comprehensive metabolic panel with GFR  Essential hypertension   Plan:   Chronic, stable. Cont metformin  500 mg bid. Counseled on diet and exercise. Chronic, stablee. Cont Benicar  20 mg/d, Toprol  XL 50 mg bid F/u in 6 mo. The patient thru his wife voiced understanding and agreement to the plan.  Mabel [place], DO 11/17/23 12:36 PM

## 2023-11-17 NOTE — Patient Instructions (Signed)
 Give us  2-3 business days to get the results of your labs back.   Keep the diet clean and stay active.  Let us  know if you need anything.

## 2023-12-04 ENCOUNTER — Other Ambulatory Visit (HOSPITAL_BASED_OUTPATIENT_CLINIC_OR_DEPARTMENT_OTHER): Payer: Self-pay

## 2023-12-04 ENCOUNTER — Other Ambulatory Visit: Payer: Self-pay | Admitting: Family Medicine

## 2023-12-04 MED ORDER — FAMOTIDINE 20 MG PO TABS
20.0000 mg | ORAL_TABLET | Freq: Two times a day (BID) | ORAL | 2 refills | Status: AC
  Filled 2024-01-28: qty 60, 30d supply, fill #0
  Filled 2024-03-25: qty 60, 30d supply, fill #1

## 2024-01-04 ENCOUNTER — Other Ambulatory Visit: Payer: Self-pay | Admitting: Family Medicine

## 2024-01-04 ENCOUNTER — Other Ambulatory Visit: Payer: Self-pay

## 2024-01-04 ENCOUNTER — Other Ambulatory Visit (HOSPITAL_BASED_OUTPATIENT_CLINIC_OR_DEPARTMENT_OTHER): Payer: Self-pay

## 2024-01-04 DIAGNOSIS — R413 Other amnesia: Secondary | ICD-10-CM

## 2024-01-04 MED ORDER — DONEPEZIL HCL 5 MG PO TABS
5.0000 mg | ORAL_TABLET | Freq: Every day | ORAL | 0 refills | Status: DC
  Filled 2024-01-04: qty 90, 90d supply, fill #0

## 2024-01-04 MED ORDER — METOPROLOL SUCCINATE ER 50 MG PO TB24
50.0000 mg | ORAL_TABLET | Freq: Two times a day (BID) | ORAL | 1 refills | Status: AC
  Filled 2024-01-04: qty 180, 90d supply, fill #0

## 2024-01-08 ENCOUNTER — Other Ambulatory Visit (HOSPITAL_BASED_OUTPATIENT_CLINIC_OR_DEPARTMENT_OTHER): Payer: Self-pay

## 2024-01-08 MED ORDER — FLUZONE HIGH-DOSE 0.5 ML IM SUSY
0.5000 mL | PREFILLED_SYRINGE | Freq: Once | INTRAMUSCULAR | 0 refills | Status: AC
  Filled 2024-01-08: qty 0.5, 1d supply, fill #0

## 2024-01-28 ENCOUNTER — Other Ambulatory Visit (HOSPITAL_BASED_OUTPATIENT_CLINIC_OR_DEPARTMENT_OTHER): Payer: Self-pay

## 2024-01-28 ENCOUNTER — Other Ambulatory Visit: Payer: Self-pay | Admitting: Family Medicine

## 2024-01-28 MED ORDER — MIRTAZAPINE 30 MG PO TABS
30.0000 mg | ORAL_TABLET | Freq: Every day | ORAL | 1 refills | Status: AC
  Filled 2024-01-28: qty 90, 90d supply, fill #0

## 2024-01-28 MED ORDER — PANTOPRAZOLE SODIUM 40 MG PO TBEC
40.0000 mg | DELAYED_RELEASE_TABLET | Freq: Every day | ORAL | 1 refills | Status: AC
  Filled 2024-01-28: qty 90, 90d supply, fill #0

## 2024-02-10 ENCOUNTER — Other Ambulatory Visit (HOSPITAL_BASED_OUTPATIENT_CLINIC_OR_DEPARTMENT_OTHER): Payer: Self-pay

## 2024-02-10 ENCOUNTER — Other Ambulatory Visit: Payer: Self-pay | Admitting: Family Medicine

## 2024-02-10 MED ORDER — METFORMIN HCL 500 MG PO TABS
500.0000 mg | ORAL_TABLET | Freq: Two times a day (BID) | ORAL | 0 refills | Status: AC
  Filled 2024-02-10: qty 180, 90d supply, fill #0

## 2024-02-17 ENCOUNTER — Other Ambulatory Visit: Payer: Self-pay

## 2024-02-23 ENCOUNTER — Other Ambulatory Visit (HOSPITAL_BASED_OUTPATIENT_CLINIC_OR_DEPARTMENT_OTHER): Payer: Self-pay

## 2024-03-25 ENCOUNTER — Other Ambulatory Visit: Payer: Self-pay

## 2024-03-25 ENCOUNTER — Other Ambulatory Visit: Payer: Self-pay | Admitting: Family Medicine

## 2024-03-25 ENCOUNTER — Other Ambulatory Visit (HOSPITAL_BASED_OUTPATIENT_CLINIC_OR_DEPARTMENT_OTHER): Payer: Self-pay

## 2024-03-25 ENCOUNTER — Other Ambulatory Visit: Payer: Self-pay | Admitting: Cardiovascular Disease

## 2024-03-25 DIAGNOSIS — R413 Other amnesia: Secondary | ICD-10-CM

## 2024-03-25 DIAGNOSIS — I214 Non-ST elevation (NSTEMI) myocardial infarction: Secondary | ICD-10-CM

## 2024-03-25 MED ORDER — DONEPEZIL HCL 5 MG PO TABS
5.0000 mg | ORAL_TABLET | Freq: Every day | ORAL | 0 refills | Status: AC
  Filled 2024-03-25: qty 90, 90d supply, fill #0

## 2024-03-25 MED ORDER — CLOPIDOGREL BISULFATE 75 MG PO TABS
75.0000 mg | ORAL_TABLET | Freq: Every day | ORAL | 0 refills | Status: AC
  Filled 2024-03-25: qty 30, 30d supply, fill #0

## 2024-03-30 ENCOUNTER — Telehealth: Payer: Self-pay | Admitting: *Deleted

## 2024-03-30 NOTE — Telephone Encounter (Signed)
 Pt is due for Medicare AWV. He already has an appt with PCP on 05/24/24 at 10:30am.  I have placed a hold for him on the annual wellness visit 1 schedule at 9:40 the same day if he is agreeable. Left message for pt to return my call.  Visit must be in person or mychart video.

## 2024-03-31 NOTE — Telephone Encounter (Signed)
 Left message to return my call.

## 2024-03-31 NOTE — Telephone Encounter (Signed)
 Appt scheduled

## 2024-05-20 ENCOUNTER — Encounter: Admitting: Family Medicine

## 2024-05-24 ENCOUNTER — Encounter: Admitting: Family Medicine

## 2024-05-24 ENCOUNTER — Ambulatory Visit
